# Patient Record
Sex: Female | Born: 1990 | Race: White | Hispanic: No | Marital: Married | Smoking: Former smoker
Health system: Southern US, Community
[De-identification: ages and names within clinical notes are randomized; demographics above are authoritative.]

## PROBLEM LIST (undated history)

## (undated) ENCOUNTER — Inpatient Hospital Stay (HOSPITAL_COMMUNITY): Payer: Self-pay

## (undated) DIAGNOSIS — F32A Depression, unspecified: Secondary | ICD-10-CM

## (undated) DIAGNOSIS — Z789 Other specified health status: Secondary | ICD-10-CM

## (undated) DIAGNOSIS — D649 Anemia, unspecified: Secondary | ICD-10-CM

## (undated) DIAGNOSIS — F329 Major depressive disorder, single episode, unspecified: Secondary | ICD-10-CM

## (undated) DIAGNOSIS — F419 Anxiety disorder, unspecified: Secondary | ICD-10-CM

## (undated) HISTORY — PX: NO PAST SURGERIES: SHX2092

## (undated) HISTORY — PX: WISDOM TOOTH EXTRACTION: SHX21

---
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
## 2010-04-20 LAB — URINALYSIS, ROUTINE W REFLEX MICROSCOPIC
Bilirubin Urine: NEGATIVE
Glucose, UA: NEGATIVE mg/dL
Hgb urine dipstick: NEGATIVE
Ketones, ur: NEGATIVE mg/dL
Nitrite: NEGATIVE
Protein, ur: NEGATIVE mg/dL
Specific Gravity, Urine: 1.01 (ref 1.005–1.030)
Urobilinogen, UA: 0.2 mg/dL (ref 0.0–1.0)
pH: 7 (ref 5.0–8.0)

## 2010-04-20 LAB — POCT PREGNANCY, URINE: Preg Test, Ur: NEGATIVE

## 2010-10-28 LAB — WET PREP, GENITAL
Clue Cells Wet Prep HPF POC: NONE SEEN
Trich, Wet Prep: NONE SEEN
Yeast Wet Prep HPF POC: NONE SEEN

## 2010-10-28 LAB — DIFFERENTIAL
Basophils Relative: 0
Eosinophils Absolute: 0
Eosinophils Relative: 1
Lymphs Abs: 2.7
Monocytes Relative: 6

## 2010-10-28 LAB — URINALYSIS, ROUTINE W REFLEX MICROSCOPIC
Bilirubin Urine: NEGATIVE
Glucose, UA: NEGATIVE
Hgb urine dipstick: NEGATIVE
Nitrite: NEGATIVE
Protein, ur: NEGATIVE
Specific Gravity, Urine: 1.034 — ABNORMAL HIGH
Urobilinogen, UA: 0.2
pH: 6

## 2010-10-28 LAB — CBC
HCT: 34.3 — ABNORMAL LOW
MCHC: 34.3
MCV: 79.9
Platelets: 228
WBC: 7.2

## 2010-10-28 LAB — BASIC METABOLIC PANEL
BUN: 9
CO2: 23
Chloride: 104
Creatinine, Ser: 0.75
Potassium: 3.5

## 2010-10-28 LAB — POCT PREGNANCY, URINE
Operator id: 22937
Preg Test, Ur: NEGATIVE

## 2010-10-28 LAB — URINE CULTURE
Colony Count: NO GROWTH
Culture: NO GROWTH

## 2010-10-28 LAB — GC/CHLAMYDIA PROBE AMP, GENITAL: Chlamydia, DNA Probe: NEGATIVE

## 2010-12-16 ENCOUNTER — Encounter: Payer: Self-pay | Admitting: Emergency Medicine

## 2010-12-16 ENCOUNTER — Emergency Department (HOSPITAL_COMMUNITY)
Admission: EM | Admit: 2010-12-16 | Discharge: 2010-12-16 | Disposition: A | Discharge status: Home / Self Care | Payer: Medicaid Other | Attending: Emergency Medicine | Admitting: Emergency Medicine

## 2010-12-16 DIAGNOSIS — R300 Dysuria: Secondary | ICD-10-CM

## 2010-12-16 DIAGNOSIS — R10819 Abdominal tenderness, unspecified site: Secondary | ICD-10-CM | POA: Insufficient documentation

## 2010-12-16 DIAGNOSIS — N898 Other specified noninflammatory disorders of vagina: Secondary | ICD-10-CM | POA: Insufficient documentation

## 2010-12-16 LAB — URINALYSIS, ROUTINE W REFLEX MICROSCOPIC
Glucose, UA: NEGATIVE mg/dL
Hgb urine dipstick: NEGATIVE
Ketones, ur: NEGATIVE mg/dL
Protein, ur: NEGATIVE mg/dL
Urobilinogen, UA: 0.2 mg/dL (ref 0.0–1.0)

## 2010-12-16 LAB — WET PREP, GENITAL: Clue Cells Wet Prep HPF POC: NONE SEEN

## 2010-12-16 MED ORDER — SULFAMETHOXAZOLE-TRIMETHOPRIM 800-160 MG PO TABS: 1.0000 | ORAL_TABLET | Freq: Two times a day (BID) | ORAL | Status: AC

## 2010-12-16 NOTE — ED Notes (Signed)
Introduced self to patient. Asked her to change into a gown.

## 2010-12-16 NOTE — ED Provider Notes (Signed)
History     CSN: [id number] Arrival date & time: 12/16/2010  6:18 AM   First MD Initiated Contact with Patient 12/16/10 0701      Chief Complaint  Patient presents with  . Urinary Tract Infection    pt  c/o constant pressure and urge to urinate    (Consider location/radiation/quality/duration/timing/severity/associated sxs/prior treatment) Patient is a 20 y.o. female presenting with urinary tract infection. The history is provided by the patient.  Urinary Tract Infection   Patient here with complaints of urinary urgency and frequency x24 hours. Denies fever or back pain or green-yellow vaginal discharge. History of UTI before before in the past and this feels like that. No vomiting or diarrhea noted symptoms worse with urinating pain is characterized as sharp History reviewed. No pertinent past medical history.  History reviewed. No pertinent past surgical history.  Family History  Problem Relation Age of Onset  . Diabetes Other     History  Substance Use Topics  . Smoking status: Never Smoker   . Smokeless tobacco: Not on file  . Alcohol Use: Yes     socialy    OB History    Grav Para Term Preterm Abortions TAB SAB Ect Mult Living                  Review of Systems  All other systems reviewed and are negative.    Allergies  Penicillins  Home Medications  No current outpatient prescriptions on file.  BP 109/62  Pulse 60  Temp(Src) 98.3 F (36.8 C) (Oral)  Resp 16  Ht 5\' 7"  (1.702 m)  Wt 160 lb (72.576 kg)  BMI 25.06 kg/m2  SpO2 100%  LMP 11/26/2010  Physical Exam  Nursing note and vitals reviewed. Constitutional: She is oriented to person, place, and time. Vital signs are normal. She appears well-developed and well-nourished.  Non-toxic appearance. No distress.  HENT:  Head: Normocephalic and atraumatic.  Eyes: Conjunctivae and EOM are normal. Pupils are equal, round, and reactive to light.  Neck: Normal range of motion. Neck supple. No tracheal  deviation present.  Cardiovascular: Normal rate, regular rhythm and normal heart sounds.  Exam reveals no gallop.   No murmur heard. Pulmonary/Chest: Effort normal and breath sounds normal. No stridor. No respiratory distress. She has no wheezes.  Abdominal: Soft. Normal appearance and bowel sounds are normal. She exhibits no distension. There is tenderness in the suprapubic area. There is no rigidity, no rebound and no guarding.  Genitourinary: No bleeding around the vagina. No foreign body around the vagina. Vaginal discharge found.  Musculoskeletal: Normal range of motion. She exhibits no edema and no tenderness.  Neurological: She is alert and oriented to person, place, and time. She has normal strength. No cranial nerve deficit or sensory deficit. GCS eye subscore is 4. GCS verbal subscore is 5. GCS motor subscore is 6.  Skin: Skin is warm and dry.  Psychiatric: She has a normal mood and affect. Her speech is normal and behavior is normal.    ED Course  Procedures (including critical care time)   Labs Reviewed  URINALYSIS, ROUTINE W REFLEX MICROSCOPIC  POCT PREGNANCY, URINE   No results found.   No diagnosis found.    MDM  Patient to be given prescription for Bactrim in case her symptoms persist. wet prep results noted        Toy Baker, MD 12/16/10 [phone]

## 2010-12-16 NOTE — ED Notes (Addendum)
Pt presented to the ER with c/o possible urinary infection, pt states has Hx of UTI, pt also reports tick,white vaginal d/c. Pt further states that s/s started about month ago, while at work, pt states she is holding to urinate, secondary to the nature of her  Job. Pt states that she noted vaginal d/c as well, whit and thick discharge

## 2010-12-16 NOTE — ED Notes (Signed)
Pt states that symptoms have been present for a month.  She did not have current medicaid so she did not go to her PCP for symptoms. She took AZO for the symptoms, but "it doesn't work well."

## 2010-12-20 LAB — GC/CHLAMYDIA PROBE AMP, GENITAL
Chlamydia, DNA Probe: POSITIVE — AB
GC Probe Amp, Genital: NEGATIVE

## 2010-12-24 NOTE — ED Notes (Signed)
+   chlamydia, patient notified of positive result by phone after ID verified x two. RX Azithromycin one gram PO called to [facility] on [address] per pt request.

## 2011-11-07 ENCOUNTER — Emergency Department (HOSPITAL_COMMUNITY)
Admission: EM | Admit: 2011-11-07 | Discharge: 2011-11-07 | Disposition: A | Discharge status: Home / Self Care | Payer: Medicaid Other | Attending: Emergency Medicine | Admitting: Emergency Medicine

## 2011-11-07 ENCOUNTER — Encounter (HOSPITAL_COMMUNITY): Payer: Self-pay

## 2011-11-07 DIAGNOSIS — F17200 Nicotine dependence, unspecified, uncomplicated: Secondary | ICD-10-CM | POA: Insufficient documentation

## 2011-11-07 DIAGNOSIS — Z880 Allergy status to penicillin: Secondary | ICD-10-CM | POA: Insufficient documentation

## 2011-11-07 DIAGNOSIS — Z3201 Encounter for pregnancy test, result positive: Secondary | ICD-10-CM | POA: Insufficient documentation

## 2011-11-07 DIAGNOSIS — Z3490 Encounter for supervision of normal pregnancy, unspecified, unspecified trimester: Secondary | ICD-10-CM

## 2011-11-07 LAB — POCT PREGNANCY, URINE: Preg Test, Ur: POSITIVE — AB

## 2011-11-07 MED ORDER — PRENATAL COMPLETE 14-0.4 MG PO TABS: 1.0000 | ORAL_TABLET | Freq: Every day | ORAL | Status: DC

## 2011-11-07 NOTE — ED Notes (Signed)
Discharged by Sherlynn Stalls RN- Social services in to speak with this pt before discharge

## 2011-11-07 NOTE — ED Notes (Signed)
Pt presents with no acute distress.  Pt took home pregnancy POSITIVE- Denies abdominal pain, vaginal discharge N/V/D and fever.  Here only for verification of pregnancy for validation for medicaid

## 2011-11-07 NOTE — ED Provider Notes (Signed)
Medical screening examination/treatment/procedure(s) were performed by non-physician practitioner and as supervising physician I was immediately available for consultation/collaboration.  Cheri Guppy, MD 11/07/11 1630

## 2011-11-07 NOTE — ED Provider Notes (Signed)
History     CSN: [id number]  Arrival date & time 11/07/11  1001   First MD Initiated Contact with Patient 11/07/11 1029      Chief Complaint  Patient presents with  . requesting pregnancy test     (Consider location/radiation/quality/duration/timing/severity/associated sxs/prior treatment) HPI Comments: G0P0 presents to ED requesting confirmation of a positive pregnancy test.  States LMP was August 5, states she spotted August 30-September 2 but never had her period.  Has had intermittent mild abdominal cramping and nausea, also constipation.  Abdominal cramping was resolved after large bowel movement this morning.  States she took pregnancy tests all last week that were negative, then 4 days ago took one that was positive.  Is here for confirmation so she can be approved for medicaid.  Denies fevers, vomiting, any recent vaginal bleeding or abnormal discharge. Denies urinary symptoms.  Denies current abdominal pain or cramping.   The history is provided by the patient.    History reviewed. No pertinent past medical history.  History reviewed. No pertinent past surgical history.  Family History  Problem Relation Age of Onset  . Diabetes Other     History  Substance Use Topics  . Smoking status: Current Every Day Smoker  . Smokeless tobacco: Not on file  . Alcohol Use: Yes     socialy    OB History    Grav Para Term Preterm Abortions TAB SAB Ect Mult Living                  Review of Systems  Constitutional: Negative for fever.  Gastrointestinal: Positive for nausea. Negative for vomiting and abdominal pain.  Genitourinary: Positive for menstrual problem. Negative for dysuria, urgency, frequency, vaginal bleeding and vaginal discharge.    Allergies  Penicillins  Home Medications   Current Outpatient Rx  Name Route Sig Dispense Refill  . ACETAMINOPHEN 500 MG PO TABS Oral Take 1,000 mg by mouth every 6 (six) hours as needed. For pain.    Marland Kitchen PRENATAL MULTIVITAMIN  CH Oral Take 2 tablets by mouth daily.      BP 129/78  Pulse 81  Temp 98.6 F (37 C) (Oral)  Resp 18  Ht 5\' 7"  (1.702 m)  Wt 138 lb 6.4 oz (62.778 kg)  BMI 21.68 kg/m2  SpO2 100%  LMP 10/12/2011  Physical Exam  Nursing note and vitals reviewed. Constitutional: She appears well-developed and well-nourished. No distress.  HENT:  Head: Normocephalic and atraumatic.  Neck: Neck supple.  Cardiovascular: Normal rate and regular rhythm.   Pulmonary/Chest: Effort normal and breath sounds normal. No respiratory distress. She has no wheezes. She has no rales.  Abdominal: Soft. She exhibits no distension and no mass. There is no tenderness. There is no rebound and no guarding.  Neurological: She is alert.  Skin: She is not diaphoretic.    ED Course  Procedures (including critical care time)  Labs Reviewed  POCT PREGNANCY, URINE - Abnormal; Notable for the following:    Preg Test, Ur POSITIVE (*)     All other components within normal limits   No results found.   1. Pregnancy     MDM  Pt presenting to ED for confirmation of positive pregnancy test.  Pregnancy test is positive.  Pt denies abdominal pain, vaginal discharge or bleeding.  Pt advised that she needs to take daily prenatal vitamin and follow up with obgyn.  Resources given.  Pt also advised that she should go to [facility] with any abdominal  pain, vaginal discharge or bleeding. Discussed ectopic pregnancy with patient's significant other and importance of going to [facility] given previously mentioned symptoms.  Discussed all results with patient.  Pt given return precautions.  Pt verbalizes understanding and agrees with plan.           [place], [place] 11/07/11 1127

## 2011-11-08 ENCOUNTER — Encounter (HOSPITAL_COMMUNITY): Payer: Self-pay

## 2011-12-11 ENCOUNTER — Other Ambulatory Visit: Payer: Self-pay

## 2011-12-11 ENCOUNTER — Other Ambulatory Visit (HOSPITAL_COMMUNITY): Payer: Self-pay | Admitting: Obstetrics and Gynecology

## 2011-12-11 ENCOUNTER — Other Ambulatory Visit: Payer: Self-pay | Admitting: Family

## 2011-12-11 DIAGNOSIS — Z3682 Encounter for antenatal screening for nuchal translucency: Secondary | ICD-10-CM

## 2011-12-18 ENCOUNTER — Encounter (HOSPITAL_COMMUNITY): Payer: Self-pay | Admitting: Obstetrics and Gynecology

## 2011-12-19 ENCOUNTER — Encounter (HOSPITAL_COMMUNITY): Payer: Self-pay | Admitting: Obstetrics and Gynecology

## 2012-01-01 ENCOUNTER — Other Ambulatory Visit: Payer: Self-pay

## 2012-01-01 ENCOUNTER — Encounter (HOSPITAL_COMMUNITY): Payer: Self-pay

## 2012-01-01 ENCOUNTER — Ambulatory Visit (HOSPITAL_COMMUNITY)
Admission: RE | Admit: 2012-01-01 | Discharge: 2012-01-01 | Disposition: A | Discharge status: Home / Self Care | Payer: Medicaid Other | Source: Ambulatory Visit | Attending: Obstetrics and Gynecology | Admitting: Obstetrics and Gynecology

## 2012-01-01 DIAGNOSIS — Z3689 Encounter for other specified antenatal screening: Secondary | ICD-10-CM | POA: Insufficient documentation

## 2012-01-01 DIAGNOSIS — O351XX0 Maternal care for (suspected) chromosomal abnormality in fetus, not applicable or unspecified: Secondary | ICD-10-CM | POA: Insufficient documentation

## 2012-01-01 DIAGNOSIS — O3510X0 Maternal care for (suspected) chromosomal abnormality in fetus, unspecified, not applicable or unspecified: Secondary | ICD-10-CM | POA: Insufficient documentation

## 2012-01-01 DIAGNOSIS — Z3682 Encounter for antenatal screening for nuchal translucency: Secondary | ICD-10-CM

## 2012-01-01 NOTE — Progress Notes (Signed)
Ms. Erica Webb had an ultrasound appointment today.  Please see [facility] report for details.  Impression Mason Jim, intrauterine pregnancy at 12w 2d on first trimester evaluation with limited morphologic survey that is appropriate for the assigned gestational age. The nuchal translucency measures 1.1 mm, which is normal for the gestational age. The nasal bone is seen on today's evaluation and recorded as present on the risk assessment.   Recommendations Regardless of the results of today's evaluation, completion of the first trimester screen analytes is both recommended and arranged for your patient. I also recommend a second trimester AFP to occur around 15-[redacted] weeks gestational age. Anatomy scan has been previously scheduled to occur at around [redacted] weeks gestation.  Rogelia Boga, MD

## 2012-01-03 LAB — OB RESULTS CONSOLE GC/CHLAMYDIA: Gonorrhea: NEGATIVE

## 2012-01-03 LAB — OB RESULTS CONSOLE ANTIBODY SCREEN: Antibody Screen: NEGATIVE

## 2012-01-17 ENCOUNTER — Emergency Department (HOSPITAL_COMMUNITY): Payer: Medicaid Other

## 2012-01-17 ENCOUNTER — Encounter (HOSPITAL_COMMUNITY): Payer: Self-pay

## 2012-01-17 ENCOUNTER — Emergency Department (HOSPITAL_COMMUNITY)
Admission: EM | Admit: 2012-01-17 | Discharge: 2012-01-17 | Disposition: A | Discharge status: Home / Self Care | Payer: Medicaid Other | Attending: Emergency Medicine | Admitting: Emergency Medicine

## 2012-01-17 DIAGNOSIS — O2340 Unspecified infection of urinary tract in pregnancy, unspecified trimester: Secondary | ICD-10-CM

## 2012-01-17 DIAGNOSIS — O210 Mild hyperemesis gravidarum: Secondary | ICD-10-CM | POA: Insufficient documentation

## 2012-01-17 DIAGNOSIS — J069 Acute upper respiratory infection, unspecified: Secondary | ICD-10-CM | POA: Insufficient documentation

## 2012-01-17 DIAGNOSIS — N390 Urinary tract infection, site not specified: Secondary | ICD-10-CM | POA: Insufficient documentation

## 2012-01-17 DIAGNOSIS — R109 Unspecified abdominal pain: Secondary | ICD-10-CM | POA: Insufficient documentation

## 2012-01-17 DIAGNOSIS — R197 Diarrhea, unspecified: Secondary | ICD-10-CM | POA: Insufficient documentation

## 2012-01-17 DIAGNOSIS — O9989 Other specified diseases and conditions complicating pregnancy, childbirth and the puerperium: Secondary | ICD-10-CM | POA: Insufficient documentation

## 2012-01-17 LAB — INFLUENZA PANEL BY PCR (TYPE A & B): H1N1 flu by pcr: NOT DETECTED

## 2012-01-17 LAB — URINE MICROSCOPIC-ADD ON

## 2012-01-17 LAB — URINALYSIS, ROUTINE W REFLEX MICROSCOPIC
Bilirubin Urine: NEGATIVE
Ketones, ur: NEGATIVE mg/dL
Nitrite: NEGATIVE
Protein, ur: NEGATIVE mg/dL
Urobilinogen, UA: 0.2 mg/dL (ref 0.0–1.0)

## 2012-01-17 MED ORDER — PROMETHAZINE HCL 25 MG PO TABS: 25.0000 mg | ORAL_TABLET | Freq: Four times a day (QID) | ORAL | Status: DC | PRN

## 2012-01-17 MED ORDER — NITROFURANTOIN MONOHYD MACRO 100 MG PO CAPS: 100.0000 mg | ORAL_CAPSULE | Freq: Two times a day (BID) | ORAL | Status: DC

## 2012-01-17 MED ORDER — OSELTAMIVIR PHOSPHATE 75 MG PO CAPS: 75.0000 mg | ORAL_CAPSULE | Freq: Two times a day (BID) | ORAL | Status: DC

## 2012-01-17 MED ORDER — INFLUENZA VIRUS VACC SPLIT PF IM SUSP
0.5000 mL | INTRAMUSCULAR | Status: AC | PRN
  Administered 2012-01-17: 0.5 mL via INTRAMUSCULAR
  Filled 2012-01-17: qty 0.5

## 2012-01-17 MED ORDER — NITROFURANTOIN MONOHYD MACRO 100 MG PO CAPS
100.0000 mg | ORAL_CAPSULE | Freq: Once | ORAL | Status: AC
  Administered 2012-01-17: 100 mg via ORAL
  Filled 2012-01-17: qty 1

## 2012-01-17 MED ORDER — ONDANSETRON HCL 4 MG PO TABS
4.0000 mg | ORAL_TABLET | Freq: Once | ORAL | Status: AC
  Administered 2012-01-17: 4 mg via ORAL
  Filled 2012-01-17: qty 1

## 2012-01-17 MED ORDER — OSELTAMIVIR PHOSPHATE 75 MG PO CAPS
75.0000 mg | ORAL_CAPSULE | Freq: Once | ORAL | Status: AC
  Administered 2012-01-17: 75 mg via ORAL
  Filled 2012-01-17: qty 1

## 2012-01-17 NOTE — ED Notes (Signed)
Pt c/o cough/fever/ bilateral ear pain/diarrhea/vomiting x3 days, pt state is [redacted]wk pregnant.

## 2012-01-17 NOTE — ED Provider Notes (Signed)
History     CSN: [id number]  Arrival date & time 01/17/12  0711   First MD Initiated Contact with Patient 01/17/12 0756      Chief Complaint  Patient presents with  . URI    14preg     HPI  The patient presents with multiple complaints.  Symptoms began approximately 3 days ago.  Since onset symptoms have been progressive.  She complains of cough, generalized discomfort, fever, diarrhea, vomiting, bilateral ear discomfort.  Since the onset of symptoms she has been taking azithromycin, with no appreciable change in her condition.  She notes that her fever improved with Tylenol, but recurs.  She denies confusion or disorientation.  She has spoken with her obstetrician during this illness, because she is [redacted] weeks pregnant.  This pregnancy has been complicated, with one prior ultrasound that was normal according to the patient.  No influenza vaccination.  History reviewed. No pertinent past medical history.  History reviewed. No pertinent past surgical history.  Family History  Problem Relation Age of Onset  . Diabetes Other     History  Substance Use Topics  . Smoking status: Never Smoker   . Smokeless tobacco: Not on file  . Alcohol Use: No     Comment: socialy    OB History    Grav Para Term Preterm Abortions TAB SAB Ect Mult Living   1               Review of Systems  Constitutional:       Per HPI, otherwise negative  HENT:       Per HPI, otherwise negative  Eyes: Negative.   Respiratory:       Per HPI, otherwise negative  Cardiovascular:       Per HPI, otherwise negative  Gastrointestinal: Positive for nausea, vomiting, abdominal pain and diarrhea.  Genitourinary: Negative.   Musculoskeletal:       Per HPI, otherwise negative  Skin: Negative.   Neurological: Negative for syncope.    Allergies  Penicillins  Home Medications   Current Outpatient Rx  Name  Route  Sig  Dispense  Refill  . ACETAMINOPHEN 500 MG PO TABS   Oral   Take 1,000 mg by mouth  every 6 (six) hours as needed. For pain.         Marland Kitchen PRENATAL COMPLETE 14-0.4 MG PO TABS   Oral   Take 1 tablet by mouth daily.   60 each   5   . PROMETHAZINE HCL 25 MG PO TABS   Oral   Take 25 mg by mouth every 6 (six) hours as needed. nausea           BP 127/66  Pulse 110  Temp 99.1 F (37.3 C) (Oral)  Resp 16  SpO2 99%  LMP 09/11/2011  Physical Exam  Nursing note and vitals reviewed. Constitutional: She is oriented to person, place, and time. She appears well-developed and well-nourished. No distress.  HENT:  Head: Normocephalic and atraumatic.  Eyes: Conjunctivae normal and EOM are normal.  Cardiovascular: Regular rhythm.  Tachycardia present.   Pulmonary/Chest: Effort normal and breath sounds normal. No stridor. No respiratory distress.  Abdominal: Normal appearance. She exhibits no distension. There is no tenderness.    Musculoskeletal: She exhibits no edema.  Neurological: She is alert and oriented to person, place, and time. No cranial nerve deficit.  Skin: Skin is warm and dry.  Psychiatric: She has a normal mood and affect.    ED Course  [place] bedside Date/Time: 01/17/2012 8:15 AM Performed by: Gerhard Munch Authorized by: Gerhard Munch Consent: Verbal consent obtained. The procedure was performed in an emergent situation. Risks and benefits: risks, benefits and alternatives were discussed Consent given by: patient Patient identity confirmed: verbally with patient Time out: Immediately prior to procedure a "time out" was called to verify the correct patient, procedure, equipment, support staff and site/side marked as required. Preparation: Patient was prepped and draped in the usual sterile fashion. Local anesthesia used: no Patient sedated: no Patient tolerance: Patient tolerated the procedure well with no immediate complications. Comments: Indication: abd pain / fever/ n/v/d in pregnanacy  FHT ~170 w good motion. IUP visualized. Procedure well  tolerated.    (including critical care time)  Labs Reviewed  POCT PREGNANCY, URINE - Abnormal; Notable for the following:    Preg Test, Ur POSITIVE (*)     All other components within normal limits  URINALYSIS, ROUTINE W REFLEX MICROSCOPIC  INFLUENZA PANEL BY PCR   No results found.   No diagnosis found.  O2- 99%ra, normal  10:04 AM I informed the patient of all results thus far.  We discussed the need for prompt F/U.    MDM  This generally well female, [redacted] weeks pregnant, presents with multiple complaints, notably cough, generalized discomfort, fever, nausea, vomiting.  Given her description of symptoms, the consideration of influenza.  Given the increased M/M of pregnant females with influenza, and the current ongoing environment with demonstrated cases locally, the patient received vaccine, initial antivirals, and labs were sent.  The patient's evaluation is notable for demonstration of urinary tract infection as well.  She received antibiotics for this.  She was discharged in stable condition, though with the need for close follow-up, including the need to receive confirmation of influenza.    Gerhard Munch, MD 01/17/12 1007

## 2012-01-18 LAB — URINE CULTURE

## 2012-02-01 ENCOUNTER — Other Ambulatory Visit (HOSPITAL_COMMUNITY): Payer: Self-pay | Admitting: Obstetrics and Gynecology

## 2012-02-01 DIAGNOSIS — Z0489 Encounter for examination and observation for other specified reasons: Secondary | ICD-10-CM

## 2012-02-07 NOTE — L&D Delivery Note (Signed)
Delivery Note I was called to attend this delivery due to precipitous pushing. At 1:27 AM a viable female was delivered via Vaginal, Spontaneous Delivery (Presentation: Right Occiput Anterior). A loose nuchal cord was  Reduced.  The shoulders were tight, so the posterior (right) axilla was grasped and the baby rotated clockwise into an oblique position.  The (now) anterior shoulder was released, and the body delivered slowly after that.  At no time was any traction placed on the fetal head. APGAR: , ; weight 7lbs 11 oz .  At this point, Dr. Henderson Cloud came in and took over care. Placenta status: , .  Cord: 3 vessels with the following complications: mild shoulder dystocia  Erica Webb,Erica Webb 07/12/2012, 1:37 AM    Addendum  Although I was on L&D, I was not called for delivery.  After the delivery by midwife above, I noted a 1st degree subclitoral tear that I repaired with 3-0 vicryl R with a catheter in place.  Placenta was intact and the blood loss was expected.  Baby was vigorous to bedside.  Apgars were 7,9.  [name] A

## 2012-02-12 ENCOUNTER — Ambulatory Visit (HOSPITAL_COMMUNITY)
Admission: RE | Admit: 2012-02-12 | Discharge: 2012-02-12 | Disposition: A | Discharge status: Home / Self Care | Payer: Medicaid Other | Source: Ambulatory Visit | Attending: Obstetrics and Gynecology | Admitting: Obstetrics and Gynecology

## 2012-02-12 ENCOUNTER — Other Ambulatory Visit (HOSPITAL_COMMUNITY): Payer: Self-pay | Admitting: Obstetrics and Gynecology

## 2012-02-12 VITALS — BP 111/63 | HR 93 | Wt 139.5 lb

## 2012-02-12 DIAGNOSIS — Z1389 Encounter for screening for other disorder: Secondary | ICD-10-CM | POA: Insufficient documentation

## 2012-02-12 DIAGNOSIS — O4440 Low lying placenta NOS or without hemorrhage, unspecified trimester: Secondary | ICD-10-CM

## 2012-02-12 DIAGNOSIS — Z0489 Encounter for examination and observation for other specified reasons: Secondary | ICD-10-CM

## 2012-02-12 DIAGNOSIS — Z363 Encounter for antenatal screening for malformations: Secondary | ICD-10-CM | POA: Insufficient documentation

## 2012-02-12 DIAGNOSIS — O358XX0 Maternal care for other (suspected) fetal abnormality and damage, not applicable or unspecified: Secondary | ICD-10-CM | POA: Insufficient documentation

## 2012-02-12 NOTE — Progress Notes (Signed)
[facility] ultrasound  Indication: 22 yr old G1P0 at [redacted]w[redacted]d for fetal anatomic survey.  Findings: 1. Single intrauterine pregnancy. 2. Fetal biometry is consistent with dating. 3. Posterior placenta that is low lying; the placental edge is 2.2cm from the internal cervical os. 4. Normal transvaginal cervical length. 5. Normal amniotic fluid volume. 6. Normal fetal anatomic survey.  Recommendations: 1. Appropriate fetal growth. 2. Normal fetal anatomic survey. 3. Had normal first trimester screen. 4. Low lying placenta: - Discussed slight increased risk of bleeding - do not recommend activity restrictions unless bleeding develops - Discussed bleeding precautions- patient is to call her primary Ob with any vaginal bleeding  - discussed that given placenta is 2.2cm from the os; even if there is no change is likely okay for vaginal delivery ; however recommend follow up ultrasound at 28 weeks to reevaluate placental location  Eulis Foster, MD

## 2012-03-30 ENCOUNTER — Encounter (HOSPITAL_COMMUNITY): Payer: Self-pay | Admitting: Emergency Medicine

## 2012-03-30 ENCOUNTER — Emergency Department (HOSPITAL_COMMUNITY)
Admission: EM | Admit: 2012-03-30 | Discharge: 2012-03-30 | Disposition: A | Discharge status: Home / Self Care | Payer: Medicaid Other | Attending: Emergency Medicine | Admitting: Emergency Medicine

## 2012-03-30 DIAGNOSIS — N390 Urinary tract infection, site not specified: Secondary | ICD-10-CM

## 2012-03-30 DIAGNOSIS — J3489 Other specified disorders of nose and nasal sinuses: Secondary | ICD-10-CM | POA: Insufficient documentation

## 2012-03-30 DIAGNOSIS — J069 Acute upper respiratory infection, unspecified: Secondary | ICD-10-CM

## 2012-03-30 DIAGNOSIS — J029 Acute pharyngitis, unspecified: Secondary | ICD-10-CM | POA: Insufficient documentation

## 2012-03-30 DIAGNOSIS — Z3490 Encounter for supervision of normal pregnancy, unspecified, unspecified trimester: Secondary | ICD-10-CM

## 2012-03-30 DIAGNOSIS — R350 Frequency of micturition: Secondary | ICD-10-CM | POA: Insufficient documentation

## 2012-03-30 DIAGNOSIS — L299 Pruritus, unspecified: Secondary | ICD-10-CM | POA: Insufficient documentation

## 2012-03-30 DIAGNOSIS — R059 Cough, unspecified: Secondary | ICD-10-CM | POA: Insufficient documentation

## 2012-03-30 DIAGNOSIS — H9209 Otalgia, unspecified ear: Secondary | ICD-10-CM | POA: Insufficient documentation

## 2012-03-30 DIAGNOSIS — R0982 Postnasal drip: Secondary | ICD-10-CM | POA: Insufficient documentation

## 2012-03-30 DIAGNOSIS — R05 Cough: Secondary | ICD-10-CM | POA: Insufficient documentation

## 2012-03-30 DIAGNOSIS — O9989 Other specified diseases and conditions complicating pregnancy, childbirth and the puerperium: Secondary | ICD-10-CM | POA: Insufficient documentation

## 2012-03-30 DIAGNOSIS — O2390 Unspecified genitourinary tract infection in pregnancy, unspecified trimester: Secondary | ICD-10-CM | POA: Insufficient documentation

## 2012-03-30 DIAGNOSIS — R300 Dysuria: Secondary | ICD-10-CM | POA: Insufficient documentation

## 2012-03-30 LAB — URINALYSIS, ROUTINE W REFLEX MICROSCOPIC
Glucose, UA: NEGATIVE mg/dL
Hgb urine dipstick: NEGATIVE
Ketones, ur: NEGATIVE mg/dL
Protein, ur: NEGATIVE mg/dL
Urobilinogen, UA: 0.2 mg/dL (ref 0.0–1.0)

## 2012-03-30 LAB — URINE MICROSCOPIC-ADD ON

## 2012-03-30 MED ORDER — NITROFURANTOIN MONOHYD MACRO 100 MG PO CAPS: 100.0000 mg | ORAL_CAPSULE | Freq: Two times a day (BID) | ORAL | Status: DC

## 2012-03-30 NOTE — ED Provider Notes (Signed)
History     CSN: [id number]  Arrival date & time 03/30/12  1143   First MD Initiated Contact with Patient 03/30/12 1213      Chief Complaint  Patient presents with  . Otalgia  . Sore Throat    (Consider location/radiation/quality/duration/timing/severity/associated sxs/prior treatment) HPI Comments: Patient presents today with sore throat, ear pain, productive cough, sinus pressure, rhinorrhea, and nasal congestion.  She reports that her symptoms began three days ago and have been progressively worsening.  She has not taken anything for her symptoms prior to arrival in the ED.  She reports that her husband and coworker have similar symptoms.  Daughter was diagnosed with Strep throat yesterday.  She denies fever, chest pain, or SOB.  Denies nausea, vomiting, or diarrhea.  She is currently pregnant.  Denies abdominal pain, pelvic pain, vaginal discharge or vaginal bleeding.  She is also complaining of dysuria and increased frequency over the past several days.  She has a history of Urinary Tract Infections.    The history is provided by the patient.    History reviewed. No pertinent past medical history.  History reviewed. No pertinent past surgical history.  Family History  Problem Relation Age of Onset  . Diabetes Other     History  Substance Use Topics  . Smoking status: Never Smoker   . Smokeless tobacco: Not on file  . Alcohol Use: No     Comment: socialy    OB History   Grav Para Term Preterm Abortions TAB SAB Ect Mult Living   1               Review of Systems  Constitutional: Negative for fever and chills.  HENT: Positive for ear pain, congestion, sore throat, rhinorrhea, postnasal drip and sinus pressure. Negative for drooling, trouble swallowing, neck pain, neck stiffness and voice change.   Respiratory: Positive for cough. Negative for shortness of breath and wheezing.   Gastrointestinal: Negative for nausea, vomiting and abdominal pain.  Genitourinary:  Positive for dysuria and frequency. Negative for urgency, vaginal bleeding and pelvic pain.  Neurological: Negative for headaches.  All other systems reviewed and are negative.    Allergies  Penicillins  Home Medications   Current Outpatient Rx  Name  Route  Sig  Dispense  Refill  . Prenatal Vit-Fe Fumarate-FA (PRENATAL COMPLETE) 14-0.4 MG TABS   Oral   Take 1 tablet by mouth daily.   60 each   5     BP 112/66  Pulse 93  Temp(Src) 98.7 F (37.1 C) (Oral)  Resp 17  SpO2 100%  LMP 09/11/2011  Physical Exam  Nursing note and vitals reviewed. Constitutional: She appears well-developed and well-nourished. No distress.  HENT:  Head: Normocephalic and atraumatic. No trismus in the jaw.  Right Ear: Tympanic membrane, external ear and ear canal normal.  Left Ear: Tympanic membrane, external ear and ear canal normal.  Nose: Rhinorrhea present. Right sinus exhibits no maxillary sinus tenderness and no frontal sinus tenderness. Left sinus exhibits no maxillary sinus tenderness and no frontal sinus tenderness.  Mouth/Throat: Uvula is midline, oropharynx is clear and moist and mucous membranes are normal. No edematous. No oropharyngeal exudate, posterior oropharyngeal edema or tonsillar abscesses.  Neck: Normal range of motion. Neck supple.  Cardiovascular: Normal rate, regular rhythm and normal heart sounds.   Pulmonary/Chest: Effort normal and breath sounds normal.  Neurological: She is alert.  Skin: Skin is warm and dry. No rash noted. She is not diaphoretic.  ED Course  Procedures (including critical care time)  Labs Reviewed  RAPID STREP SCREEN  URINALYSIS, ROUTINE W REFLEX MICROSCOPIC   No results found.   No diagnosis found.    MDM  Symptoms consistent with Viral URI.  Patient afebrile with pulse ox 100 on RA.  Therefore, do not feel that CXR is indicated at this time.  Patient is currently pregnant.  She denies any vaginal bleeding, abdominal or pelvic pain.   UA shows small leukocytes and many bacteria.  However, appears to be contaminated sample.  However, due to the fact that the patient is having symptoms will treat.  Feel patient stable for discharge.  Return precautions discussed.        Pascal Lux [place], PA-C 03/30/12 [phone]

## 2012-03-30 NOTE — ED Notes (Signed)
Pt states that she started having itching and "fluid like feeling in her ears" as well as sore and scratchy throat.  Her step daughter was tested positive for strep throat yesterday.  Pt states that she also has been coughing up green colored phlegm for 2 days. Denies n/v, or  Fevers.

## 2012-03-31 LAB — URINE CULTURE
Colony Count: NO GROWTH
Special Requests: NORMAL

## 2012-03-31 NOTE — ED Provider Notes (Signed)
Medical screening examination/treatment/procedure(s) were performed by non-physician practitioner and as supervising physician I was immediately available for consultation/collaboration.  Christopher J. Pollina, MD 03/31/12 0751 

## 2012-04-09 ENCOUNTER — Encounter (HOSPITAL_COMMUNITY): Payer: Self-pay

## 2012-04-09 ENCOUNTER — Ambulatory Visit (HOSPITAL_COMMUNITY): Payer: Medicaid Other

## 2012-04-09 ENCOUNTER — Ambulatory Visit (HOSPITAL_COMMUNITY)
Admission: RE | Admit: 2012-04-09 | Discharge: 2012-04-09 | Disposition: A | Discharge status: Home / Self Care | Payer: Medicaid Other | Source: Ambulatory Visit | Attending: Obstetrics and Gynecology | Admitting: Obstetrics and Gynecology

## 2012-04-09 DIAGNOSIS — Z3689 Encounter for other specified antenatal screening: Secondary | ICD-10-CM | POA: Insufficient documentation

## 2012-04-09 DIAGNOSIS — O4400 Placenta previa specified as without hemorrhage, unspecified trimester: Secondary | ICD-10-CM | POA: Insufficient documentation

## 2012-04-09 DIAGNOSIS — O4440 Low lying placenta NOS or without hemorrhage, unspecified trimester: Secondary | ICD-10-CM

## 2012-05-22 ENCOUNTER — Encounter (HOSPITAL_COMMUNITY): Payer: Self-pay | Admitting: *Deleted

## 2012-05-22 ENCOUNTER — Inpatient Hospital Stay (HOSPITAL_COMMUNITY)
Admission: AD | Admit: 2012-05-22 | Discharge: 2012-05-22 | Disposition: A | Discharge status: Home / Self Care | Payer: Medicaid Other | Source: Ambulatory Visit | Attending: Obstetrics & Gynecology | Admitting: Obstetrics & Gynecology

## 2012-05-22 DIAGNOSIS — O4700 False labor before 37 completed weeks of gestation, unspecified trimester: Secondary | ICD-10-CM | POA: Insufficient documentation

## 2012-05-22 DIAGNOSIS — O479 False labor, unspecified: Secondary | ICD-10-CM

## 2012-05-22 DIAGNOSIS — O4703 False labor before 37 completed weeks of gestation, third trimester: Secondary | ICD-10-CM

## 2012-05-22 DIAGNOSIS — R109 Unspecified abdominal pain: Secondary | ICD-10-CM | POA: Insufficient documentation

## 2012-05-22 LAB — URINALYSIS, ROUTINE W REFLEX MICROSCOPIC
Bilirubin Urine: NEGATIVE
Glucose, UA: NEGATIVE mg/dL
Hgb urine dipstick: NEGATIVE
Ketones, ur: NEGATIVE mg/dL
Protein, ur: NEGATIVE mg/dL
pH: 6 (ref 5.0–8.0)

## 2012-05-22 NOTE — MAU Provider Note (Signed)
Chief Complaint:  Abdominal Pain   First Provider Initiated Contact with Patient 05/22/12 2232      HPI: Erica Webb is a 22 y.o. G1P0 at [redacted]w[redacted]d who presents to maternity admissions reporting  1. Increased pelvic pressure. 2. Intermittent sharp low abd pain and uterine tightening, worse during work yesterday. 3. Mucus discharge x 3 days.   Denies leakage of fluid or vaginal bleeding. Good fetal movement.   Past Medical History: History reviewed. No pertinent past medical history.  Past obstetric history: OB History   Grav Para Term Preterm Abortions TAB SAB Ect Mult Living   1              # Outc Date GA Lbr Len/2nd Wgt Sex Del Anes PTL Lv   1 CUR               Past Surgical History: History reviewed. No pertinent past surgical history.  Family History: Family History  Problem Relation Age of Onset  . Diabetes Other     Social History: History  Substance Use Topics  . Smoking status: Never Smoker   . Smokeless tobacco: Not on file  . Alcohol Use: No     Comment: socialy    Allergies:  Allergies  Allergen Reactions  . Penicillins Anaphylaxis    Pt repots laryngeal edema    Meds:  Prescriptions prior to admission  Medication Sig Dispense Refill  . acetaminophen (TYLENOL) 500 MG tablet Take 1,000 mg by mouth every 6 (six) hours as needed for pain.      . calcium carbonate (TUMS EX) 750 MG chewable tablet Chew 2 tablets by mouth daily as needed for heartburn.      . Prenatal Vit-Fe Fumarate-FA (PRENATAL COMPLETE) 14-0.4 MG TABS Take 1 tablet by mouth daily.  60 each  5    ROS: Pertinent findings in history of present illness.  Physical Exam  Blood pressure 140/77, pulse 95, temperature 98.6 F (37 C), temperature source Oral, resp. rate 18, height 5\' 7"  (1.702 m), weight 73.936 kg (163 lb), last menstrual period 09/11/2011, SpO2 100.00%. GENERAL: Well-developed, well-nourished female in mild distress.  HEENT: normocephalic HEART: normal  rate RESP: normal effort ABDOMEN: Soft, non-tender, gravid appropriate for gestational age EXTREMITIES: Nontender, no edema NEURO: alert and oriented SPECULUM EXAM: NEFG, physiologic discharge, no blood, cervix clean Dilation: 1 Effacement (%): 60 Station: -3 Presentation: Vertex Exam by:: [employer] CNM  FHT:  Baseline 130 , moderate variability, accelerations present, no decelerations Contractions: irreg, mild   Labs: Results for orders placed during the hospital encounter of 05/22/12 (from the past 24 hour(s))  URINALYSIS, ROUTINE W REFLEX MICROSCOPIC     Status: Abnormal   Collection Time    05/22/12  9:35 PM      Result Value Range   Color, Urine YELLOW  YELLOW   APPearance CLEAR  CLEAR   Specific Gravity, Urine >1.030 (*) 1.005 - 1.030   pH 6.0  5.0 - 8.0   Glucose, UA NEGATIVE  NEGATIVE mg/dL   Hgb urine dipstick NEGATIVE  NEGATIVE   Bilirubin Urine NEGATIVE  NEGATIVE   Ketones, ur NEGATIVE  NEGATIVE mg/dL   Protein, ur NEGATIVE  NEGATIVE mg/dL   Urobilinogen, UA 0.2  0.0 - 1.0 mg/dL   Nitrite NEGATIVE  NEGATIVE   Leukocytes, UA NEGATIVE  NEGATIVE  FETAL FIBRONECTIN     Status: None   Collection Time    05/22/12 10:42 PM      Result Value Range  Fetal Fibronectin NEGATIVE  NEGATIVE    Imaging:  No results found.  MAU Course:   Assessment: 1. Preterm uterine contractions, third trimester    Plan: Discharge home per consult w/ Dr. Arlyce Dice. Preterm labor precautions and fetal kick counts. Pelvic rest. Increase rest and fluids.     Follow-up Information   Follow up with Mickel Baas, MD On 05/30/2012.   Contact information:   [address] [place] [address] [phone]       Follow up with [facility]. (As needed if symptoms worsen)    Contact information:   [address] [place] [address] [phone]       Medication List    TAKE these medications        acetaminophen 500 MG tablet  Commonly known as:  TYLENOL  Take 1,000 mg by mouth every 6 (six) hours as needed for pain.     calcium carbonate 750 MG chewable tablet  Commonly known as:  TUMS EX  Chew 2 tablets by mouth daily as needed for heartburn.     Prenatal Complete 14-0.4 MG Tabs  Take 1 tablet by mouth daily.        [place], [place] 05/22/2012 11:23 PM

## 2012-05-22 NOTE — MAU Note (Signed)
Pt presents with complaint for pressure and sharp pains in lower abd. Also reports a mucus discharge x 3 days. States today she has felt lightheaded and her feet are "going numb"

## 2012-06-17 ENCOUNTER — Other Ambulatory Visit: Payer: Self-pay | Admitting: Obstetrics and Gynecology

## 2012-07-11 ENCOUNTER — Inpatient Hospital Stay (HOSPITAL_COMMUNITY)
Admission: AD | Admit: 2012-07-11 | Discharge: 2012-07-13 | DRG: 775 | Disposition: A | Discharge status: Home / Self Care | Payer: Medicaid Other | Source: Ambulatory Visit | Attending: Obstetrics and Gynecology | Admitting: Obstetrics and Gynecology

## 2012-07-11 ENCOUNTER — Inpatient Hospital Stay (HOSPITAL_COMMUNITY): Payer: Medicaid Other | Admitting: Anesthesiology

## 2012-07-11 ENCOUNTER — Encounter (HOSPITAL_COMMUNITY): Payer: Self-pay | Admitting: Anesthesiology

## 2012-07-11 ENCOUNTER — Encounter (HOSPITAL_COMMUNITY): Payer: Self-pay | Admitting: Family Medicine

## 2012-07-11 DIAGNOSIS — O9903 Anemia complicating the puerperium: Secondary | ICD-10-CM | POA: Diagnosis not present

## 2012-07-11 DIAGNOSIS — D6489 Other specified anemias: Secondary | ICD-10-CM | POA: Diagnosis not present

## 2012-07-11 LAB — CBC
MCHC: 34.4 g/dL (ref 30.0–36.0)
RDW: 13.4 % (ref 11.5–15.5)

## 2012-07-11 MED ORDER — LIDOCAINE HCL (PF) 1 % IJ SOLN
30.0000 mL | INTRAMUSCULAR | Status: DC | PRN
  Filled 2012-07-11 (×2): qty 30

## 2012-07-11 MED ORDER — EPHEDRINE 5 MG/ML INJ
10.0000 mg | INTRAVENOUS | Status: DC | PRN
  Filled 2012-07-11: qty 2

## 2012-07-11 MED ORDER — LACTATED RINGERS IV SOLN
INTRAVENOUS | Status: DC
  Administered 2012-07-11: via INTRAVENOUS

## 2012-07-11 MED ORDER — OXYTOCIN 40 UNITS IN LACTATED RINGERS INFUSION - SIMPLE MED
62.5000 mL/h | INTRAVENOUS | Status: DC
  Filled 2012-07-11: qty 1000

## 2012-07-11 MED ORDER — EPHEDRINE 5 MG/ML INJ
10.0000 mg | INTRAVENOUS | Status: DC | PRN
  Filled 2012-07-11: qty 2
  Filled 2012-07-11: qty 4

## 2012-07-11 MED ORDER — DIPHENHYDRAMINE HCL 50 MG/ML IJ SOLN: 12.5000 mg | INTRAMUSCULAR | Status: DC | PRN

## 2012-07-11 MED ORDER — ONDANSETRON HCL 4 MG/2ML IJ SOLN: 4.0000 mg | Freq: Four times a day (QID) | INTRAMUSCULAR | Status: DC | PRN

## 2012-07-11 MED ORDER — FENTANYL 2.5 MCG/ML BUPIVACAINE 1/10 % EPIDURAL INFUSION (WH - ANES)
14.0000 mL/h | INTRAMUSCULAR | Status: DC | PRN
  Filled 2012-07-11: qty 125

## 2012-07-11 MED ORDER — PHENYLEPHRINE 40 MCG/ML (10ML) SYRINGE FOR IV PUSH (FOR BLOOD PRESSURE SUPPORT)
80.0000 ug | PREFILLED_SYRINGE | INTRAVENOUS | Status: DC | PRN
  Filled 2012-07-11: qty 5
  Filled 2012-07-11: qty 2

## 2012-07-11 MED ORDER — OXYTOCIN BOLUS FROM INFUSION
500.0000 mL | INTRAVENOUS | Status: DC
  Administered 2012-07-12: 500 mL via INTRAVENOUS

## 2012-07-11 MED ORDER — LIDOCAINE HCL (PF) 1 % IJ SOLN
INTRAMUSCULAR | Status: DC | PRN
  Administered 2012-07-11 (×2): 4 mL

## 2012-07-11 MED ORDER — IBUPROFEN 600 MG PO TABS
600.0000 mg | ORAL_TABLET | Freq: Four times a day (QID) | ORAL | Status: DC | PRN
  Administered 2012-07-12: 600 mg via ORAL
  Filled 2012-07-11: qty 1

## 2012-07-11 MED ORDER — CITRIC ACID-SODIUM CITRATE 334-500 MG/5ML PO SOLN: 30.0000 mL | ORAL | Status: DC | PRN

## 2012-07-11 MED ORDER — OXYCODONE-ACETAMINOPHEN 5-325 MG PO TABS: 1.0000 | ORAL_TABLET | ORAL | Status: DC | PRN

## 2012-07-11 MED ORDER — FENTANYL 2.5 MCG/ML BUPIVACAINE 1/10 % EPIDURAL INFUSION (WH - ANES)
INTRAMUSCULAR | Status: DC | PRN
  Administered 2012-07-11: 14 mL/h via EPIDURAL

## 2012-07-11 MED ORDER — NALBUPHINE SYRINGE 5 MG/0.5 ML
5.0000 mg | INJECTION | INTRAMUSCULAR | Status: DC | PRN
  Filled 2012-07-11: qty 0.5

## 2012-07-11 MED ORDER — LACTATED RINGERS IV SOLN: 500.0000 mL | INTRAVENOUS | Status: DC | PRN

## 2012-07-11 MED ORDER — PHENYLEPHRINE 40 MCG/ML (10ML) SYRINGE FOR IV PUSH (FOR BLOOD PRESSURE SUPPORT)
80.0000 ug | PREFILLED_SYRINGE | INTRAVENOUS | Status: DC | PRN
  Filled 2012-07-11: qty 2

## 2012-07-11 MED ORDER — ACETAMINOPHEN 325 MG PO TABS: 650.0000 mg | ORAL_TABLET | ORAL | Status: DC | PRN

## 2012-07-11 MED ORDER — LACTATED RINGERS IV SOLN
500.0000 mL | Freq: Once | INTRAVENOUS | Status: AC
  Administered 2012-07-11: 500 mL via INTRAVENOUS

## 2012-07-11 MED ORDER — FLEET ENEMA 7-19 GM/118ML RE ENEM: 1.0000 | ENEMA | RECTAL | Status: DC | PRN

## 2012-07-11 NOTE — H&P (Signed)
22 y.o. [redacted]w[redacted]d  G1P0 comes in c/o SROM clear at 2100.  Otherwise has good fetal movement and no bleeding.  No past medical history on file. No past surgical history on file.  OB History   Grav Para Term Preterm Abortions TAB SAB Ect Mult Living   1              # Outc Date GA Lbr Len/2nd Wgt Sex Del Anes PTL Lv   1 CUR               History   Social History  . Marital Status: Single    Spouse Name: N/A    Number of Children: N/A  . Years of Education: N/A   Occupational History  . Not on file.   Social History Main Topics  . Smoking status: Never Smoker   . Smokeless tobacco: Not on file  . Alcohol Use: No     Comment: socialy  . Drug Use: No  . Sexually Active: Yes    Birth Control/ Protection: Condom   Other Topics Concern  . Not on file   Social History Narrative  . No narrative on file   Penicillins    Prenatal Transfer Tool  Maternal Diabetes: No Genetic Screening: Normal Maternal Ultrasounds/Referrals: Normal Fetal Ultrasounds or other Referrals:  None Maternal Substance Abuse:  No Significant Maternal Medications:  None Significant Maternal Lab Results: None  Other NWG:[id number].    There were no vitals filed for this visit.   Lungs/Cor:  NAD Abdomen:  soft, gravid Ex:  no cords, erythema SVE:  4-5/C/-2 per nurse. FHTs:  120s , good STV, NST R Toco:  q3-5   A/P   Term labor, SROM.  GBS neg.  Tammey Deeg A

## 2012-07-11 NOTE — Anesthesia Procedure Notes (Signed)
Epidural Patient location during procedure: OB Start time: 07/11/2012 11:28 PM  Staffing Anesthesiologist: Madolin Twaddle A. Performed by: anesthesiologist   Preanesthetic Checklist Completed: patient identified, site marked, surgical consent, pre-op evaluation, timeout performed, IV checked, risks and benefits discussed and monitors and equipment checked  Epidural Patient position: sitting Prep: site prepped and draped and DuraPrep Patient monitoring: continuous pulse ox and blood pressure Approach: midline Injection technique: LOR air  Needle:  Needle type: Tuohy  Needle gauge: 17 G Needle length: 9 cm and 9 Needle insertion depth: 4 cm Catheter type: closed end flexible Catheter size: 19 Gauge Catheter at skin depth: 9 cm Test dose: negative and Other  Assessment Events: blood not aspirated, injection not painful, no injection resistance, negative IV test and no paresthesia  Additional Notes Patient identified. Risks and benefits discussed including failed block, incomplete  Pain control, post dural puncture headache, nerve damage, paralysis, blood pressure Changes, nausea, vomiting, reactions to medications-both toxic and allergic and post Partum back pain. All questions were answered. Patient expressed understanding and wished to proceed. Sterile technique was used throughout procedure. Epidural site was Dressed with sterile barrier dressing. No paresthesias, signs of intravascular injection Or signs of intrathecal spread were encountered.  Patient was more comfortable after the epidural was dosed. Please see RN's note for documentation of vital signs and FHR which are stable.

## 2012-07-11 NOTE — MAU Note (Signed)
Pt states she has been having U/C since 1500 and SROM at 2100 with clear fluid and some vaginal bloody show.  Good fetal movement.

## 2012-07-11 NOTE — Anesthesia Preprocedure Evaluation (Signed)
Anesthesia Evaluation  Patient identified by MRN, date of birth, ID band Patient awake    Reviewed: Allergy & Precautions, H&P , Patient's Chart, lab work & pertinent test results  Airway Mallampati: III TM Distance: >3 FB Neck ROM: Full    Dental no notable dental hx. (+) Teeth Intact   Pulmonary neg pulmonary ROS,    Pulmonary exam normal       Cardiovascular negative cardio ROS  Rhythm:Regular Rate:Normal     Neuro/Psych negative neurological ROS  negative psych ROS   GI/Hepatic negative GI ROS, Neg liver ROS,   Endo/Other  negative endocrine ROS  Renal/GU negative Renal ROS  negative genitourinary   Musculoskeletal negative musculoskeletal ROS (+)   Abdominal   Peds  Hematology negative hematology ROS (+)   Anesthesia Other Findings   Reproductive/Obstetrics (+) Pregnancy                           Anesthesia Physical Anesthesia Plan  ASA: II  Anesthesia Plan: Epidural   Post-op Pain Management:    Induction:   Airway Management Planned: Natural Airway  Additional Equipment:   Intra-op Plan:   Post-operative Plan:   Informed Consent: I have reviewed the patients History and Physical, chart, labs and discussed the procedure including the risks, benefits and alternatives for the proposed anesthesia with the patient or authorized representative who has indicated his/her understanding and acceptance.   Dental advisory given  Plan Discussed with: Anesthesiologist  Anesthesia Plan Comments:         Anesthesia Quick Evaluation

## 2012-07-12 ENCOUNTER — Encounter (HOSPITAL_COMMUNITY): Payer: Self-pay | Admitting: Family Medicine

## 2012-07-12 LAB — RPR: RPR Ser Ql: NONREACTIVE

## 2012-07-12 LAB — CBC
HCT: 25.4 % — ABNORMAL LOW (ref 36.0–46.0)
MCHC: 34.3 g/dL (ref 30.0–36.0)

## 2012-07-12 MED ORDER — PRENATAL MULTIVITAMIN CH
1.0000 | ORAL_TABLET | Freq: Every day | ORAL | Status: DC
  Administered 2012-07-12: 1 via ORAL
  Filled 2012-07-12: qty 1

## 2012-07-12 MED ORDER — FERROUS SULFATE 325 (65 FE) MG PO TABS
325.0000 mg | ORAL_TABLET | Freq: Two times a day (BID) | ORAL | Status: DC
  Administered 2012-07-12 – 2012-07-13 (×3): 325 mg via ORAL
  Filled 2012-07-12 (×3): qty 1

## 2012-07-12 MED ORDER — METHYLERGONOVINE MALEATE 0.2 MG PO TABS: 0.2000 mg | ORAL_TABLET | ORAL | Status: DC | PRN

## 2012-07-12 MED ORDER — SODIUM CHLORIDE 0.9 % IJ SOLN: 3.0000 mL | Freq: Two times a day (BID) | INTRAMUSCULAR | Status: DC

## 2012-07-12 MED ORDER — ONDANSETRON HCL 4 MG PO TABS: 4.0000 mg | ORAL_TABLET | ORAL | Status: DC | PRN

## 2012-07-12 MED ORDER — ZOLPIDEM TARTRATE 5 MG PO TABS: 5.0000 mg | ORAL_TABLET | Freq: Every evening | ORAL | Status: DC | PRN

## 2012-07-12 MED ORDER — ONDANSETRON HCL 4 MG/2ML IJ SOLN: 4.0000 mg | INTRAMUSCULAR | Status: DC | PRN

## 2012-07-12 MED ORDER — MAGNESIUM HYDROXIDE 400 MG/5ML PO SUSP: 30.0000 mL | ORAL | Status: DC | PRN

## 2012-07-12 MED ORDER — IBUPROFEN 800 MG PO TABS
800.0000 mg | ORAL_TABLET | Freq: Three times a day (TID) | ORAL | Status: DC
  Administered 2012-07-12 – 2012-07-13 (×4): 800 mg via ORAL
  Filled 2012-07-12 (×4): qty 1

## 2012-07-12 MED ORDER — DIBUCAINE 1 % RE OINT: 1.0000 "application " | TOPICAL_OINTMENT | RECTAL | Status: DC | PRN

## 2012-07-12 MED ORDER — BENZOCAINE-MENTHOL 20-0.5 % EX AERO
1.0000 "application " | INHALATION_SPRAY | CUTANEOUS | Status: DC | PRN
  Administered 2012-07-12: 1 via TOPICAL
  Filled 2012-07-12: qty 56

## 2012-07-12 MED ORDER — WITCH HAZEL-GLYCERIN EX PADS: 1.0000 "application " | MEDICATED_PAD | CUTANEOUS | Status: DC | PRN

## 2012-07-12 MED ORDER — MEASLES, MUMPS & RUBELLA VAC ~~LOC~~ INJ
0.5000 mL | INJECTION | Freq: Once | SUBCUTANEOUS | Status: DC
  Filled 2012-07-12: qty 0.5

## 2012-07-12 MED ORDER — TETANUS-DIPHTH-ACELL PERTUSSIS 5-2.5-18.5 LF-MCG/0.5 IM SUSP
0.5000 mL | Freq: Once | INTRAMUSCULAR | Status: AC
  Administered 2012-07-12: 0.5 mL via INTRAMUSCULAR

## 2012-07-12 MED ORDER — SENNOSIDES-DOCUSATE SODIUM 8.6-50 MG PO TABS
2.0000 | ORAL_TABLET | Freq: Every day | ORAL | Status: DC
  Administered 2012-07-12: 2 via ORAL

## 2012-07-12 MED ORDER — METHYLERGONOVINE MALEATE 0.2 MG/ML IJ SOLN: 0.2000 mg | INTRAMUSCULAR | Status: DC | PRN

## 2012-07-12 MED ORDER — SODIUM CHLORIDE 0.9 % IJ SOLN: 3.0000 mL | INTRAMUSCULAR | Status: DC | PRN

## 2012-07-12 MED ORDER — OXYCODONE-ACETAMINOPHEN 5-325 MG PO TABS
1.0000 | ORAL_TABLET | ORAL | Status: DC | PRN
  Administered 2012-07-12 (×2): 1 via ORAL
  Filled 2012-07-12 (×2): qty 1

## 2012-07-12 MED ORDER — DIPHENHYDRAMINE HCL 25 MG PO CAPS: 25.0000 mg | ORAL_CAPSULE | Freq: Four times a day (QID) | ORAL | Status: DC | PRN

## 2012-07-12 MED ORDER — SIMETHICONE 80 MG PO CHEW: 80.0000 mg | CHEWABLE_TABLET | ORAL | Status: DC | PRN

## 2012-07-12 MED ORDER — SODIUM CHLORIDE 0.9 % IV SOLN: 250.0000 mL | INTRAVENOUS | Status: DC | PRN

## 2012-07-12 MED ORDER — LANOLIN HYDROUS EX OINT: TOPICAL_OINTMENT | CUTANEOUS | Status: DC | PRN

## 2012-07-12 NOTE — Lactation Note (Signed)
This note was copied from the chart of Erica Allayna Erlich. Lactation Consultation Note  Patient Name: Erica Webb [id number] Date: 07/12/2012 Reason for consult: Initial assessment   Maternal Data Formula Feeding for Exclusion: No Infant to breast within first hour of birth: Yes Does the patient have breastfeeding experience prior to this delivery?: No  Feeding    LATCH Score/Interventions                      Lactation Tools Discussed/Used     Consult Status Consult Status: Follow-up Date: 07/13/12 Follow-up type: In-patient  Mom reports that baby nursed well after delivery but has been sleepy this morning. Reports that she just tried to nurse but he is too sleepy. Reviewed feeding cues and encouraged to feed whenever she sees them. Reports that it hurt some while he was nursing- reviewed wide open mouth and having the baby deep onto the breast and keeping the baby close throughout the feeding. BF brochure given with resources for support after DC. To call for assist prn. Encouraged skin too skin as much as possible today.No questions at present.   Pamelia Hoit 07/12/2012, 10:29 AM

## 2012-07-12 NOTE — Anesthesia Postprocedure Evaluation (Signed)
Anesthesia Post Note  Patient: Erica Webb  Procedure(s) Performed: * No procedures listed *  Anesthesia type: Epidural  Patient location: Mother/Baby  Post pain: Pain level controlled  Post assessment: Post-op Vital signs reviewed  Last Vitals:  Filed Vitals:   07/12/12 0415  BP: 105/66  Pulse: 105  Temp: 36.8 C  Resp: 18    Post vital signs: Reviewed  Level of consciousness:alert  Complications: No apparent anesthesia complications  

## 2012-07-12 NOTE — Anesthesia Postprocedure Evaluation (Signed)
Anesthesia Post Note  Patient: Erica Webb  Procedure(s) Performed: * No procedures listed *  Anesthesia type: Epidural  Patient location: Mother/Baby  Post pain: Pain level controlled  Post assessment: Post-op Vital signs reviewed  Last Vitals:  Filed Vitals:   07/12/12 0415  BP: 105/66  Pulse: 105  Temp: 36.8 C  Resp: 18    Post vital signs: Reviewed  Level of consciousness:alert  Complications: No apparent anesthesia complications

## 2012-07-12 NOTE — Progress Notes (Signed)
Post Partum Day 0 Subjective: no complaints, up ad lib, voiding and tolerating PO  Objective: Blood pressure 109/73, pulse 92, temperature 97.6 F (36.4 C), temperature source Oral, resp. rate 16, height 5\' 7"  (1.702 m), weight 81.194 kg (179 lb), last menstrual period 09/11/2011, SpO2 100.00%, unknown if currently breastfeeding.  Physical Exam:  General: alert, cooperative and appears stated age Lochia: appropriate Uterine Fundus: firm    Recent Labs  07/11/12 2215 07/12/12 0610  HGB 12.1 8.7*  HCT 35.2* 25.4*    Assessment/Plan: Breastfeeding Routine care   LOS: 1 day   Telesforo Brosnahan H. 07/12/2012, 10:20 AM

## 2012-07-13 MED ORDER — DOCUSATE SODIUM 100 MG PO CAPS: 100.0000 mg | ORAL_CAPSULE | Freq: Two times a day (BID) | ORAL | Status: DC

## 2012-07-13 MED ORDER — OXYCODONE-ACETAMINOPHEN 5-325 MG PO TABS: 2.0000 | ORAL_TABLET | ORAL | Status: DC | PRN

## 2012-07-13 MED ORDER — IBUPROFEN 600 MG PO TABS: 600.0000 mg | ORAL_TABLET | Freq: Four times a day (QID) | ORAL | Status: DC | PRN

## 2012-07-15 ENCOUNTER — Inpatient Hospital Stay (HOSPITAL_COMMUNITY): Admission: RE | Admit: 2012-07-15 | Payer: Medicaid Other | Source: Ambulatory Visit

## 2012-07-15 NOTE — Discharge Summary (Signed)
Obstetric Discharge Summary Reason for Admission: Labor Prenatal Procedures: Ultrasound Intrapartum Procedures: None Postpartum Procedures: None Complications-Operative and Postpartum: 1st degree Hemoglobin  Date Value Range Status  07/12/2012 8.7* 12.0 - 15.0 g/dL Final     DELTA CHECK NOTED     REPEATED TO VERIFY     HCT  Date Value Range Status  07/12/2012 25.4* 36.0 - 46.0 % Final    Physical Exam:  General: AOx3 Lochia: Appropriate Uterine Fundus: firm   Discharge Diagnoses: Term pregnancy delivered, Anemia  Discharge Information: Date: 07/15/2012 Activity: Pelvic rest Diet: Routine Medications: Percocet, colace, ibuprofen Condition: Improved Instructions: Per practice booklet Discharge to: Home   Newborn Data: Live born female  Birth Weight: 7 lb 11 oz (3487 g) APGAR: 7, 9  Home with Mother  Erica Webb. 07/15/2012, 2:38 PM

## 2013-02-06 NOTE — L&D Delivery Note (Signed)
Delivery Note At 5:26 AM a viable and healthy female was delivered via Vaginal, Spontaneous Delivery (Presentation: ; Occiput Anterior).  APGAR: 9, 9; weight 7 lb 3.8 oz (3283 g).   Placenta status: Intact, Spontaneous.  Cord: 3 vessels with the following complications:  Anesthesia: Epidural  Episiotomy: None Lacerations: None Suture Repair: n/a Est. Blood Loss (mL):   Mom to postpartum.  Baby to Couplet care / Skin to Skin.  Biviana Saddler 07/12/2013, 7:33 AM

## 2013-02-17 LAB — OB RESULTS CONSOLE ABO/RH: RH Type: POSITIVE

## 2013-02-17 LAB — OB RESULTS CONSOLE GC/CHLAMYDIA: GC PROBE AMP, GENITAL: NEGATIVE

## 2013-02-17 LAB — OB RESULTS CONSOLE RPR: RPR: NONREACTIVE

## 2013-02-17 LAB — OB RESULTS CONSOLE ANTIBODY SCREEN: ANTIBODY SCREEN: NEGATIVE

## 2013-02-17 LAB — OB RESULTS CONSOLE HEPATITIS B SURFACE ANTIGEN: HEP B S AG: NEGATIVE

## 2013-02-17 LAB — OB RESULTS CONSOLE HIV ANTIBODY (ROUTINE TESTING): HIV: NONREACTIVE

## 2013-02-17 LAB — OB RESULTS CONSOLE RUBELLA ANTIBODY, IGM: Rubella: NON-IMMUNE/NOT IMMUNE

## 2013-02-20 LAB — OB RESULTS CONSOLE GC/CHLAMYDIA: Chlamydia: NEGATIVE

## 2013-06-12 ENCOUNTER — Encounter (HOSPITAL_COMMUNITY): Payer: Self-pay | Admitting: *Deleted

## 2013-06-12 ENCOUNTER — Inpatient Hospital Stay (HOSPITAL_COMMUNITY)
Admission: AD | Admit: 2013-06-12 | Discharge: 2013-06-12 | Disposition: A | Discharge status: Home / Self Care | Payer: Self-pay | Source: Ambulatory Visit | Attending: Obstetrics & Gynecology | Admitting: Obstetrics & Gynecology

## 2013-06-12 DIAGNOSIS — O479 False labor, unspecified: Secondary | ICD-10-CM

## 2013-06-12 DIAGNOSIS — Z833 Family history of diabetes mellitus: Secondary | ICD-10-CM | POA: Insufficient documentation

## 2013-06-12 DIAGNOSIS — O4700 False labor before 37 completed weeks of gestation, unspecified trimester: Secondary | ICD-10-CM | POA: Insufficient documentation

## 2013-06-12 HISTORY — DX: Other specified health status: Z78.9

## 2013-06-12 LAB — URINALYSIS, ROUTINE W REFLEX MICROSCOPIC
Bilirubin Urine: NEGATIVE
Glucose, UA: NEGATIVE mg/dL
Hgb urine dipstick: NEGATIVE
Ketones, ur: NEGATIVE mg/dL
LEUKOCYTES UA: NEGATIVE
NITRITE: NEGATIVE
PH: 6 (ref 5.0–8.0)
Protein, ur: NEGATIVE mg/dL
SPECIFIC GRAVITY, URINE: 1.015 (ref 1.005–1.030)
Urobilinogen, UA: 0.2 mg/dL (ref 0.0–1.0)

## 2013-06-12 LAB — FETAL FIBRONECTIN: Fetal Fibronectin: NEGATIVE

## 2013-06-12 LAB — GROUP B STREP BY PCR: GROUP B STREP BY PCR: NEGATIVE

## 2013-06-12 MED ORDER — BETAMETHASONE SOD PHOS & ACET 6 (3-3) MG/ML IJ SUSP
12.0000 mg | Freq: Once | INTRAMUSCULAR | Status: AC
  Administered 2013-06-12: 12 mg via INTRAMUSCULAR
  Filled 2013-06-12: qty 2

## 2013-06-12 NOTE — MAU Note (Signed)
PT SAYS SHE WENT TO OFFICE  FOR REG VISIT-  SAYS HAS BEEN HAVING UC'S  ALL  DAY-  COLLECTED FFN.  AND UA    VE   BY DR ROSS-  3 CM.    LAST SEX-   4-30.     DENIES HSV AND   MRSA.

## 2013-06-12 NOTE — MAU Provider Note (Signed)
  History     CSN: [id number]  Arrival date and time: 06/12/13 1713   None     No chief complaint on file.  HPI This is a 23 y.o. female at 2289w6d who presents from the office for monitoring due to preterm contractions with cervical change. FFn was collected at the office. Pt states UCs hurt more earlier in the day than they do now. States does not feel most of them now.   RN Note:  PT SAYS SHE WENT TO OFFICE FOR REG VISIT- SAYS HAS BEEN HAVING UC'S ALL DAY- COLLECTED FFN. AND UA VE BY DR ROSS- 3 CM. LAST SEX- 4-30. DENIES HSV AND MRSA.        OB History   Grav Para Term Preterm Abortions TAB SAB Ect Mult Living   2 1 1       1       No past medical history on file.  No past surgical history on file.  Family History  Problem Relation Age of Onset  . Diabetes Other     History  Substance Use Topics  . Smoking status: Never Smoker   . Smokeless tobacco: Not on file  . Alcohol Use: No     Comment: socialy    Allergies:  Allergies  Allergen Reactions  . Penicillins Anaphylaxis    Pt repots laryngeal edema    Prescriptions prior to admission  Medication Sig Dispense Refill  . acetaminophen (TYLENOL) 325 MG tablet Take 650 mg by mouth every 6 (six) hours as needed for mild pain.      . calcium carbonate (TUMS - DOSED IN MG ELEMENTAL CALCIUM) 500 MG chewable tablet Chew 1-2 tablets by mouth daily.      . Prenatal Vit-Fe Fumarate-FA (PRENATAL MULTIVITAMIN) TABS tablet Take 1 tablet by mouth daily at 12 noon.        Review of Systems  Constitutional: Negative for fever, chills and malaise/fatigue.  Gastrointestinal: Negative for nausea, vomiting and abdominal pain.  Neurological: Negative for dizziness.   Physical Exam   Blood pressure 110/59, pulse 99, temperature 98.4 F (36.9 C), temperature source Oral, resp. rate 20, height 5\' 5"  (1.651 m), weight 70.421 kg (155 lb 4 oz), unknown if currently breastfeeding.  Physical Exam  Constitutional: She is oriented to  person, place, and time. She appears well-developed and well-nourished. No distress.  HENT:  Head: Normocephalic.  Cardiovascular: Normal rate.   Respiratory: Effort normal.  GI: Soft. There is no tenderness. There is no rebound and no guarding.  Genitourinary: Vagina normal and uterus normal. No vaginal discharge found.  Musculoskeletal: Normal range of motion.  Neurological: She is alert and oriented to person, place, and time.  Skin: Skin is warm and dry.  Psychiatric: She has a normal mood and affect.   FHR reactive Mild contractions every 3 minutes.   MAU Course  Procedures  MDM Discussed with DR Mora ApplPinn. Will offer admission for obs > pt declined. Will watch for an hour and recheck. Will give Betamethasone series.  2008: Cervix: unchanged 3/50/-3 GBS collected  Assessment and Plan  A:  SIUP at 7089w6d       Preterm contractions with neg FFn but cervical dilation  P:  S/P Betamethasone.        Report to Zorita PangH Sabien Umland CNM Will return tomorrow for 2nd dose of BMZ and check in PTL precautions Return to MAU sooner than 24 hours if needed Fetal kick counts  Erica Webb 06/12/2013, 6:17 PM

## 2013-06-12 NOTE — Discharge Instructions (Signed)
Preterm Labor Information Preterm labor is when labor starts at less than 37 weeks of pregnancy. The normal length of a pregnancy is 39 to 41 weeks. CAUSES Often, there is no identifiable underlying cause as to why a woman goes into preterm labor. One of the most common known causes of preterm labor is infection. Infections of the uterus, cervix, vagina, amniotic sac, bladder, kidney, or even the lungs (pneumonia) can cause labor to start. Other suspected causes of preterm labor include:   Urogenital infections, such as yeast infections and bacterial vaginosis.   Uterine abnormalities (uterine shape, uterine septum, fibroids, or bleeding from the placenta).   A cervix that has been operated on (it may fail to stay closed).   Malformations in the fetus.   Multiple gestations (twins, triplets, and so on).   Breakage of the amniotic sac.  RISK FACTORS  Having a previous history of preterm labor.   Having premature rupture of membranes (PROM).   Having a placenta that covers the opening of the cervix (placenta previa).   Having a placenta that separates from the uterus (placental abruption).   Having a cervix that is too weak to hold the fetus in the uterus (incompetent cervix).   Having too much fluid in the amniotic sac (polyhydramnios).   Taking illegal drugs or smoking while pregnant.   Not gaining enough weight while pregnant.   Being younger than 18 and older than 23 years old.   Having a low socioeconomic status.   Being African American. SYMPTOMS Signs and symptoms of preterm labor include:   Menstrual-like cramps, abdominal pain, or back pain.  Uterine contractions that are regular, as frequent as six in an hour, regardless of their intensity (may be mild or painful).  Contractions that start on the top of the uterus and spread down to the lower abdomen and back.   A sense of increased pelvic pressure.   A watery or bloody mucus discharge that  comes from the vagina.  TREATMENT Depending on the length of the pregnancy and other circumstances, your health care provider may suggest bed rest. If necessary, there are medicines that can be given to stop contractions and to mature the fetal lungs. If labor happens before 34 weeks of pregnancy, a prolonged hospital stay may be recommended. Treatment depends on the condition of both you and the fetus.  WHAT SHOULD YOU DO IF YOU THINK YOU ARE IN PRETERM LABOR? Call your health care provider right away. You will need to go to the hospital to get checked immediately. HOW CAN YOU PREVENT PRETERM LABOR IN FUTURE PREGNANCIES? You should:   Stop smoking if you smoke.  Maintain healthy weight gain and avoid chemicals and drugs that are not necessary.  Be watchful for any type of infection.  Inform your health care provider if you have a known history of preterm labor. Document Released: 04/15/2003 Document Revised: 09/25/2012 Document Reviewed: 02/26/2012 [employer] Patient Information 2014 [employer].    

## 2013-06-12 NOTE — MAU Note (Signed)
Urine in lab 

## 2013-06-13 ENCOUNTER — Inpatient Hospital Stay (HOSPITAL_COMMUNITY)
Admission: AD | Admit: 2013-06-13 | Discharge: 2013-06-13 | Disposition: A | Discharge status: Home / Self Care | Payer: BC Managed Care – PPO | Source: Ambulatory Visit | Attending: Obstetrics and Gynecology | Admitting: Obstetrics and Gynecology

## 2013-06-13 ENCOUNTER — Encounter (HOSPITAL_COMMUNITY): Payer: Self-pay | Admitting: *Deleted

## 2013-06-13 DIAGNOSIS — O4700 False labor before 37 completed weeks of gestation, unspecified trimester: Secondary | ICD-10-CM | POA: Insufficient documentation

## 2013-06-13 DIAGNOSIS — Z880 Allergy status to penicillin: Secondary | ICD-10-CM | POA: Insufficient documentation

## 2013-06-13 MED ORDER — NIFEDIPINE 10 MG PO CAPS
10.0000 mg | ORAL_CAPSULE | Freq: Once | ORAL | Status: AC
  Administered 2013-06-13: 10 mg via ORAL

## 2013-06-13 MED ORDER — BETAMETHASONE SOD PHOS & ACET 6 (3-3) MG/ML IJ SUSP
12.0000 mg | Freq: Once | INTRAMUSCULAR | Status: AC
  Administered 2013-06-13: 12 mg via INTRAMUSCULAR
  Filled 2013-06-13: qty 2

## 2013-06-13 MED ORDER — NIFEDIPINE 10 MG PO CAPS
10.0000 mg | ORAL_CAPSULE | Freq: Once | ORAL | Status: AC
  Administered 2013-06-13: 10 mg via ORAL
  Filled 2013-06-13: qty 1

## 2013-06-13 NOTE — MAU Provider Note (Signed)
  History     CSN: [id number]  Arrival date and time: 06/13/13 1809   None     Chief Complaint  Patient presents with  . Labor Eval   HPI  Ms. Erica Webb is a 23 y.o. female G2P1001 at 3833w0d who presents for a second dose of betamethasone. She complains of contractions every 3 minutes. She was here yesterday for evaluation of preterm labor; fetal fibronectin was negative. She reports good fetal movement, denies LOF, vaginal bleeding, vaginal itching/burning, urinary symptoms, h/a, dizziness, n/v, or fever/chills.     OB History   Grav Para Term Preterm Abortions TAB SAB Ect Mult Living   2 1 1       1       Past Medical History  Diagnosis Date  . Medical history non-contributory     Past Surgical History  Procedure Laterality Date  . Wisdom tooth extraction      Family History  Problem Relation Age of Onset  . Diabetes Other     History  Substance Use Topics  . Smoking status: Never Smoker   . Smokeless tobacco: Not on file  . Alcohol Use: No     Comment: socialy    Allergies:  Allergies  Allergen Reactions  . Penicillins Anaphylaxis    Pt repots laryngeal edema    Prescriptions prior to admission  Medication Sig Dispense Refill  . acetaminophen (TYLENOL) 325 MG tablet Take 650 mg by mouth every 6 (six) hours as needed for mild pain.      . calcium carbonate (TUMS - DOSED IN MG ELEMENTAL CALCIUM) 500 MG chewable tablet Chew 1-2 tablets by mouth daily.      . Prenatal Vit-Fe Fumarate-FA (PRENATAL MULTIVITAMIN) TABS tablet Take 1 tablet by mouth daily at 12 noon.        Review of Systems  Constitutional: Negative for fever and chills.  Gastrointestinal: Positive for abdominal pain (+ lower abdominal cramping ). Negative for nausea and vomiting.   Physical Exam   Blood pressure 119/67, pulse 97, temperature 99 F (37.2 C), temperature source Oral, resp. rate 16, unknown if currently breastfeeding.  Physical Exam  Constitutional: She is oriented  to person, place, and time. She appears well-developed and well-nourished. No distress.  HENT:  Head: Normocephalic.  Eyes: Pupils are equal, round, and reactive to light.  Neck: Neck supple.  Respiratory: Effort normal.  GI: Soft. She exhibits no distension. There is no tenderness. There is no rebound.  Musculoskeletal: Normal range of motion.  Neurological: She is alert and oriented to person, place, and time.  Skin: Skin is warm. She is not diaphoretic.  Psychiatric: Her behavior is normal.   Dilation: 3.5 Effacement (%): 80 Cervical Position: Posterior Station: -3 Presentation: Vertex Exam by:: Dorrene GermanJ. Lowe RN  Fetal Tracing: Baseline:120 bpm  Variability: Moderate  Accelerations: 15x15 Decelerations: none Toco: contractions 3-5 mins apart.    MAU Course  Procedures None  MDM 2nd dose of betamethasone given NST Consulted with Dr. Henderson CloudHorvath. Give patient 1-2 doses of procardia; if patient feels the medication has helped and contractions slow down, ok to discharge patient home, no need to recheck her cervix unless contractions intensify.   Assessment and Plan  Report given to M. Mayford KnifeWilliams CNM who resumes care of the patient   [name], NP  06/13/2013, 7:30 PM   Second dose of procardia given with lessening of contractions Patient states feels better Labor precautions Discharge home Erica Webb, CNM

## 2013-06-13 NOTE — MAU Note (Signed)
Pt here for 2nd betamethasone, states she has continued to have uc's ever since she left yesterday, denies bleeding or LOF.  Pt refuses to be placed on EFM @ this time, states she only wants her cervix checked.

## 2013-06-13 NOTE — MAU Note (Signed)
Explained to pt that we need 20 minutes of monitoring for NST to determine fetal well-being, pt agrees, EFM applied.

## 2013-06-13 NOTE — MAU Provider Note (Signed)
Reviewed case with CNM and I agree with assessment and plan  [facility] Amelianna Webb

## 2013-06-13 NOTE — MAU Note (Signed)
Patient states she is having contractions every 3 minutes and has continued since being evaluated yesterday. Her for 2nd Betamethasone injection.

## 2013-06-20 ENCOUNTER — Other Ambulatory Visit: Payer: Self-pay

## 2013-06-20 LAB — OB RESULTS CONSOLE GBS: STREP GROUP B AG: NEGATIVE

## 2013-07-11 ENCOUNTER — Inpatient Hospital Stay (HOSPITAL_COMMUNITY)
Admission: AD | Admit: 2013-07-11 | Discharge: 2013-07-13 | DRG: 775 | Disposition: A | Discharge status: Home / Self Care | Payer: BC Managed Care – PPO | Source: Ambulatory Visit | Attending: Obstetrics & Gynecology | Admitting: Obstetrics & Gynecology

## 2013-07-11 ENCOUNTER — Inpatient Hospital Stay (HOSPITAL_COMMUNITY): Payer: BC Managed Care – PPO | Admitting: Anesthesiology

## 2013-07-11 ENCOUNTER — Encounter (HOSPITAL_COMMUNITY): Payer: BC Managed Care – PPO | Admitting: Anesthesiology

## 2013-07-11 ENCOUNTER — Encounter (HOSPITAL_COMMUNITY): Payer: Self-pay | Admitting: General Practice

## 2013-07-11 DIAGNOSIS — IMO0001 Reserved for inherently not codable concepts without codable children: Secondary | ICD-10-CM

## 2013-07-11 DIAGNOSIS — Z833 Family history of diabetes mellitus: Secondary | ICD-10-CM

## 2013-07-11 LAB — CBC
HEMATOCRIT: 31.6 % — AB (ref 36.0–46.0)
HEMOGLOBIN: 10.7 g/dL — AB (ref 12.0–15.0)
MCH: 28.2 pg (ref 26.0–34.0)
MCHC: 33.9 g/dL (ref 30.0–36.0)
MCV: 83.2 fL (ref 78.0–100.0)
Platelets: 191 10*3/uL (ref 150–400)
RBC: 3.8 MIL/uL — AB (ref 3.87–5.11)
RDW: 13.4 % (ref 11.5–15.5)
WBC: 12.1 10*3/uL — AB (ref 4.0–10.5)

## 2013-07-11 MED ORDER — FENTANYL 2.5 MCG/ML BUPIVACAINE 1/10 % EPIDURAL INFUSION (WH - ANES)
14.0000 mL/h | INTRAMUSCULAR | Status: DC | PRN
  Administered 2013-07-12: 14 mL/h via EPIDURAL

## 2013-07-11 MED ORDER — DIPHENHYDRAMINE HCL 50 MG/ML IJ SOLN: 12.5000 mg | INTRAMUSCULAR | Status: DC | PRN

## 2013-07-11 MED ORDER — LIDOCAINE HCL (PF) 1 % IJ SOLN
30.0000 mL | INTRAMUSCULAR | Status: DC | PRN
  Filled 2013-07-11: qty 30

## 2013-07-11 MED ORDER — OXYTOCIN BOLUS FROM INFUSION: 500.0000 mL | INTRAVENOUS | Status: DC

## 2013-07-11 MED ORDER — LACTATED RINGERS IV SOLN: 500.0000 mL | Freq: Once | INTRAVENOUS | Status: DC

## 2013-07-11 MED ORDER — FENTANYL 2.5 MCG/ML BUPIVACAINE 1/10 % EPIDURAL INFUSION (WH - ANES)
INTRAMUSCULAR | Status: AC
  Filled 2013-07-11: qty 125

## 2013-07-11 MED ORDER — OXYCODONE-ACETAMINOPHEN 5-325 MG PO TABS: 1.0000 | ORAL_TABLET | ORAL | Status: DC | PRN

## 2013-07-11 MED ORDER — LACTATED RINGERS IV SOLN: 500.0000 mL | INTRAVENOUS | Status: DC | PRN

## 2013-07-11 MED ORDER — OXYTOCIN 40 UNITS IN LACTATED RINGERS INFUSION - SIMPLE MED
1.0000 m[IU]/min | INTRAVENOUS | Status: DC
Start: 2013-07-11 — End: 2013-07-13
  Administered 2013-07-11: 1 m[IU]/min via INTRAVENOUS
  Filled 2013-07-11: qty 1000

## 2013-07-11 MED ORDER — EPHEDRINE 5 MG/ML INJ
10.0000 mg | INTRAVENOUS | Status: DC | PRN
  Filled 2013-07-11: qty 2

## 2013-07-11 MED ORDER — OXYTOCIN 40 UNITS IN LACTATED RINGERS INFUSION - SIMPLE MED
62.5000 mL/h | INTRAVENOUS | Status: DC
  Administered 2013-07-12: 62.5 mL/h via INTRAVENOUS

## 2013-07-11 MED ORDER — FENTANYL 2.5 MCG/ML BUPIVACAINE 1/10 % EPIDURAL INFUSION (WH - ANES)
INTRAMUSCULAR | Status: DC | PRN
  Administered 2013-07-11: 14 mL/h via EPIDURAL

## 2013-07-11 MED ORDER — CITRIC ACID-SODIUM CITRATE 334-500 MG/5ML PO SOLN: 30.0000 mL | ORAL | Status: DC | PRN

## 2013-07-11 MED ORDER — EPHEDRINE 5 MG/ML INJ
10.0000 mg | INTRAVENOUS | Status: DC | PRN
Start: 2013-07-11 — End: 2013-07-13
  Filled 2013-07-11: qty 2

## 2013-07-11 MED ORDER — PHENYLEPHRINE 40 MCG/ML (10ML) SYRINGE FOR IV PUSH (FOR BLOOD PRESSURE SUPPORT)
80.0000 ug | PREFILLED_SYRINGE | INTRAVENOUS | Status: DC | PRN
  Administered 2013-07-11: 80 ug via INTRAVENOUS
  Filled 2013-07-11: qty 2

## 2013-07-11 MED ORDER — LACTATED RINGERS IV SOLN: INTRAVENOUS | Status: DC

## 2013-07-11 MED ORDER — ONDANSETRON HCL 4 MG/2ML IJ SOLN
4.0000 mg | Freq: Four times a day (QID) | INTRAMUSCULAR | Status: DC | PRN
Start: 2013-07-11 — End: 2013-07-12
  Administered 2013-07-12: 4 mg via INTRAVENOUS
  Filled 2013-07-11: qty 2

## 2013-07-11 MED ORDER — PHENYLEPHRINE 40 MCG/ML (10ML) SYRINGE FOR IV PUSH (FOR BLOOD PRESSURE SUPPORT)
80.0000 ug | PREFILLED_SYRINGE | INTRAVENOUS | Status: DC | PRN
  Filled 2013-07-11: qty 2

## 2013-07-11 MED ORDER — LIDOCAINE HCL (PF) 1 % IJ SOLN
INTRAMUSCULAR | Status: DC | PRN
  Administered 2013-07-11 (×2): 4 mL

## 2013-07-11 MED ORDER — PHENYLEPHRINE 40 MCG/ML (10ML) SYRINGE FOR IV PUSH (FOR BLOOD PRESSURE SUPPORT)
PREFILLED_SYRINGE | INTRAVENOUS | Status: AC
  Filled 2013-07-11: qty 10

## 2013-07-11 MED ORDER — EPHEDRINE 5 MG/ML INJ
INTRAVENOUS | Status: AC
  Filled 2013-07-11: qty 4

## 2013-07-11 MED ORDER — ACETAMINOPHEN 325 MG PO TABS: 650.0000 mg | ORAL_TABLET | ORAL | Status: DC | PRN

## 2013-07-11 MED ORDER — FENTANYL 2.5 MCG/ML BUPIVACAINE 1/10 % EPIDURAL INFUSION (WH - ANES)
14.0000 mL/h | INTRAMUSCULAR | Status: DC | PRN
  Filled 2013-07-11: qty 125

## 2013-07-11 MED ORDER — IBUPROFEN 600 MG PO TABS
600.0000 mg | ORAL_TABLET | Freq: Four times a day (QID) | ORAL | Status: DC | PRN
  Administered 2013-07-12: 600 mg via ORAL
  Filled 2013-07-11: qty 1

## 2013-07-11 NOTE — Anesthesia Procedure Notes (Signed)
Epidural Patient location during procedure: OB Start time: 07/11/2013 11:04 PM End time: 07/11/2013 11:14 PM  Staffing Anesthesiologist: Lewie Loron R Performed by: anesthesiologist   Preanesthetic Checklist Completed: patient identified, pre-op evaluation, timeout performed, IV checked, risks and benefits discussed and monitors and equipment checked  Epidural Patient position: sitting Prep: site prepped and draped and DuraPrep Patient monitoring: heart rate Approach: midline Location: L2-L3 Injection technique: LOR air and LOR saline  Needle:  Needle type: Tuohy  Needle gauge: 17 G Needle length: 9 cm Needle insertion depth: 7 cm Catheter type: closed end flexible Catheter size: 19 Gauge Catheter at skin depth: 13 cm Test dose: negative  Assessment Sensory level: T8 Events: blood not aspirated, injection not painful, no injection resistance, negative IV test and no paresthesia  Additional Notes Reason for block:procedure for pain

## 2013-07-11 NOTE — MAU Note (Signed)
Pt presents to mau with c/o rom. States fluid leaking since 1630 and states is more and more

## 2013-07-11 NOTE — H&P (Signed)
Chantell A Baldassarre is a 23 y.o. female admitted from MAU in early active labor at 6cm.  Maternal Medical History:  Reason for admission: Active labor possible SROM  Contractions: Frequency: irregular.   Perceived severity is mild.    Fetal activity: Perceived fetal activity is normal.   Last perceived fetal movement was within the past 12 hours.    Prenatal complications: no prenatal complications Prenatal Complications - Diabetes: none.    OB History   Grav Para Term Preterm Abortions TAB SAB Ect Mult Living   2 1 1       1      Past Medical History  Diagnosis Date  . Medical history non-contributory    Past Surgical History  Procedure Laterality Date  . Wisdom tooth extraction     Family History: family history includes Diabetes in her other. Social History:  reports that she has never smoked. She does not have any smokeless tobacco history on file. She reports that she does not drink alcohol or use illicit drugs.   Prenatal Transfer Tool  Maternal Diabetes: No Genetic Screening: Normal Maternal Ultrasounds/Referrals: Normal Fetal Ultrasounds or other Referrals:  None Maternal Substance Abuse:  No Significant Maternal Medications:  None Significant Maternal Lab Results:  Lab values include: Group B Strep negative Other Comments:  None  Review of Systems  Constitutional: Negative.  Negative for fever and chills.  HENT: Negative.   Eyes: Negative.  Negative for blurred vision.  Respiratory: Negative.   Cardiovascular: Negative.  Negative for chest pain.  Gastrointestinal: Negative.  Negative for heartburn.  Musculoskeletal: Negative.   Neurological: Negative.  Negative for headaches.    Dilation: 6 Effacement (%): 100 Station: -2 Exam by:: cherl motte, rn Blood pressure 119/66, pulse 95, temperature 98.8 F (37.1 C), temperature source Oral, resp. rate 18, height 5\' 7"  (1.702 m), weight 74.39 kg (164 lb), unknown if currently breastfeeding. Maternal Exam:   Uterine Assessment: Contraction strength is mild.  Contraction frequency is irregular.   Abdomen: Patient reports no abdominal tenderness. Fundal height is 38cm.   Estimated fetal weight is 3100 grams.   Fetal presentation: vertex  Introitus: Normal vulva. Normal vagina.  Ferning test: negative.  Nitrazine test: positive.  Pelvis: adequate for delivery.   Cervix: Cervix evaluated by sterile speculum exam and digital exam.   Patient examined in MAU: no pooling negative fern 6cm / 100/bulging bag  Physical Exam  Constitutional: She is oriented to person, place, and time. She appears well-developed and well-nourished.  Cardiovascular: Normal rate and regular rhythm.   Neurological: She is alert and oriented to person, place, and time. She has normal reflexes.    Prenatal labs: ABO, Rh: A/Positive/-- (01/12 0000) Antibody: Negative (01/12 0000) Rubella: Nonimmune (01/12 0000) RPR: Nonreactive (01/12 0000)  HBsAg: Negative (01/12 0000)  HIV: Non-reactive (01/12 0000)  GBS: Negative (05/15 0000)   Assessment/Plan: SIUP at term in early active labor at 6cm History c/w possible high leak? Although negative Crist Fat  Will start Pitocin for augmentation Epidural on demand Continuous monitoring   Dereka Lueras 07/11/2013, 6:54 PM

## 2013-07-11 NOTE — Anesthesia Preprocedure Evaluation (Signed)
Anesthesia Evaluation  Patient identified by MRN, date of birth, ID band Patient awake    Reviewed: Allergy & Precautions, H&P , Patient's Chart, lab work & pertinent test results  Airway Mallampati: III TM Distance: >3 FB Neck ROM: Full    Dental no notable dental hx. (+) Teeth Intact   Pulmonary neg pulmonary ROS,    Pulmonary exam normal       Cardiovascular negative cardio ROS  Rhythm:Regular Rate:Normal     Neuro/Psych negative neurological ROS  negative psych ROS   GI/Hepatic negative GI ROS, Neg liver ROS,   Endo/Other  negative endocrine ROS  Renal/GU negative Renal ROS     Musculoskeletal negative musculoskeletal ROS (+)   Abdominal   Peds  Hematology negative hematology ROS (+)   Anesthesia Other Findings   Reproductive/Obstetrics (+) Pregnancy                           Anesthesia Physical  Anesthesia Plan  ASA: II  Anesthesia Plan: Epidural   Post-op Pain Management:    Induction:   Airway Management Planned:   Additional Equipment:   Intra-op Plan:   Post-operative Plan:   Informed Consent: I have reviewed the patients History and Physical, chart, labs and discussed the procedure including the risks, benefits and alternatives for the proposed anesthesia with the patient or authorized representative who has indicated his/her understanding and acceptance.     Plan Discussed with:   Anesthesia Plan Comments:         Anesthesia Quick Evaluation

## 2013-07-12 ENCOUNTER — Encounter (HOSPITAL_COMMUNITY): Payer: Self-pay | Admitting: *Deleted

## 2013-07-12 MED ORDER — ZOLPIDEM TARTRATE 5 MG PO TABS: 5.0000 mg | ORAL_TABLET | Freq: Every evening | ORAL | Status: DC | PRN

## 2013-07-12 MED ORDER — OXYCODONE-ACETAMINOPHEN 5-325 MG PO TABS
1.0000 | ORAL_TABLET | ORAL | Status: DC | PRN
  Administered 2013-07-12: 1 via ORAL
  Filled 2013-07-12: qty 1

## 2013-07-12 MED ORDER — ONDANSETRON HCL 4 MG PO TABS: 4.0000 mg | ORAL_TABLET | ORAL | Status: DC | PRN

## 2013-07-12 MED ORDER — TETANUS-DIPHTH-ACELL PERTUSSIS 5-2.5-18.5 LF-MCG/0.5 IM SUSP: 0.5000 mL | Freq: Once | INTRAMUSCULAR | Status: DC

## 2013-07-12 MED ORDER — PRENATAL MULTIVITAMIN CH
1.0000 | ORAL_TABLET | Freq: Every day | ORAL | Status: DC
  Administered 2013-07-12: 1 via ORAL
  Filled 2013-07-12: qty 1

## 2013-07-12 MED ORDER — LANOLIN HYDROUS EX OINT: TOPICAL_OINTMENT | CUTANEOUS | Status: DC | PRN

## 2013-07-12 MED ORDER — SENNOSIDES-DOCUSATE SODIUM 8.6-50 MG PO TABS
2.0000 | ORAL_TABLET | ORAL | Status: DC
  Filled 2013-07-12: qty 2

## 2013-07-12 MED ORDER — DIPHENHYDRAMINE HCL 25 MG PO CAPS: 25.0000 mg | ORAL_CAPSULE | Freq: Four times a day (QID) | ORAL | Status: DC | PRN

## 2013-07-12 MED ORDER — SIMETHICONE 80 MG PO CHEW: 80.0000 mg | CHEWABLE_TABLET | ORAL | Status: DC | PRN

## 2013-07-12 MED ORDER — BENZOCAINE-MENTHOL 20-0.5 % EX AERO
1.0000 "application " | INHALATION_SPRAY | CUTANEOUS | Status: DC | PRN
  Administered 2013-07-12: 1 via TOPICAL
  Filled 2013-07-12: qty 56

## 2013-07-12 MED ORDER — LACTATED RINGERS IV SOLN: INTRAVENOUS | Status: DC

## 2013-07-12 MED ORDER — DIBUCAINE 1 % RE OINT: 1.0000 "application " | TOPICAL_OINTMENT | RECTAL | Status: DC | PRN

## 2013-07-12 MED ORDER — ONDANSETRON HCL 4 MG/2ML IJ SOLN: 4.0000 mg | INTRAMUSCULAR | Status: DC | PRN

## 2013-07-12 MED ORDER — IBUPROFEN 600 MG PO TABS
600.0000 mg | ORAL_TABLET | Freq: Four times a day (QID) | ORAL | Status: DC
  Administered 2013-07-12 – 2013-07-13 (×3): 600 mg via ORAL
  Filled 2013-07-12 (×4): qty 1

## 2013-07-12 MED ORDER — MEASLES, MUMPS & RUBELLA VAC ~~LOC~~ INJ
0.5000 mL | INJECTION | Freq: Once | SUBCUTANEOUS | Status: DC
  Filled 2013-07-12: qty 0.5

## 2013-07-12 MED ORDER — WITCH HAZEL-GLYCERIN EX PADS: 1.0000 "application " | MEDICATED_PAD | CUTANEOUS | Status: DC | PRN

## 2013-07-12 NOTE — Lactation Note (Signed)
This note was copied from the chart of Erica Webb. Lactation Consultation Note  Patient Name: Erica Webb [id number] Date: 07/12/2013 Reason for consult: Initial assessment of this second-time, experienced breastfeeding mom and her newborn, now 34 hours old.  Mom has a one year old son whom she breastfed for one month but returned to work and stopped breastfeeding.  That child has same birthday as this newborn.  LC observed new baby well-latched to (R) breast and swallows heard with stimulation since baby coming to end of feeding on second breast.  Mom states she knows how to hand express her colostrum/milk.  LC encouraged frequent STS and cue feedings.  LC encouraged review of Baby and Me pp 9, 14 and 20-25 for STS and BF information. LC provided [employer] brochure and reviewed [facility] services and list of community and web site resources.    Maternal Data Formula Feeding for Exclusion: No Infant to breast within first hour of birth: Yes Has patient been taught Hand Expression?: Yes (mom states she knwos how to hand express colostrum/milk) Does the patient have breastfeeding experience prior to this delivery?: Yes  Feeding Feeding Type: Breast Fed Length of feed: 50 min  LATCH Score/Interventions Latch: Grasps breast easily, tongue down, lips flanged, rhythmical sucking. (pt expressing desire for early discharge)  Audible Swallowing: Spontaneous and intermittent  Type of Nipple: Everted at rest and after stimulation  Comfort (Breast/Nipple): Soft / non-tender     Hold (Positioning): No assistance needed to correctly position infant at breast.  LATCH Score: 10 (mom latching independently and both RN, Sheralyn Boatman and LC observed a sustained latch with widely flanged lips and rhythmical sucking bursts/swallows)  Lactation Tools Discussed/Used   STS, cue feedings, hand expression  Consult Status Consult Status: Follow-up Date: 07/13/13 Follow-up type: In-patient    Zara Chess 07/12/2013, 9:49 PM

## 2013-07-12 NOTE — Progress Notes (Signed)
Erica Webb is a 23 y.o. G2P1001 at [redacted]w[redacted]d by LMP admitted for active labor, rupture of membranes  Subjective: Patient comfortable  Objective: BP 122/72  Pulse 75  Temp(Src) 98.2 F (36.8 C) (Oral)  Resp 20  Ht 5\' 7"  (1.702 m)  Wt 74.39 kg (164 lb)  BMI 25.68 kg/m2  SpO2 100%      FHT:  FHR: 110 bpm, variability: moderate,  accelerations:  Present,  decelerations:  Absent UC:   regular, every 4 minutes SVE:   Dilation: 8 Effacement (%): 90 Station:-2 Exam NO:MVEH  Labs: Lab Results  Component Value Date   WBC 12.1* 07/11/2013   HGB 10.7* 07/11/2013   HCT 31.6* 07/11/2013   MCV 83.2 07/11/2013   PLT 191 07/11/2013    Assessment / Plan: Augmentation of labor, progressing well  Labor: Progressing on Pitocin,  Preeclampsia:  no signs or symptoms of toxicity Fetal Wellbeing:  Category I Pain Control:  Epidural I/D:  n/a Anticipated MOD:  NSVD  Sebastian Lurz 07/12/2013, 2:06 AM

## 2013-07-12 NOTE — Anesthesia Postprocedure Evaluation (Signed)
Anesthesia Post Note  Patient: Erica Webb  Procedure(s) Performed: * No procedures listed *  Anesthesia type: Epidural  Patient location: Mother/Baby  Post pain: Pain level controlled  Post assessment: Post-op Vital signs reviewed  Last Vitals:  Filed Vitals:   07/12/13 0930  BP: 100/62  Pulse: 65  Temp: 36.9 C  Resp: 18    Post vital signs: Reviewed  Level of consciousness:alert  Complications: No apparent anesthesia complications

## 2013-07-13 LAB — CBC
HEMATOCRIT: 29.4 % — AB (ref 36.0–46.0)
Hemoglobin: 9.7 g/dL — ABNORMAL LOW (ref 12.0–15.0)
MCH: 28 pg (ref 26.0–34.0)
MCHC: 33 g/dL (ref 30.0–36.0)
MCV: 84.7 fL (ref 78.0–100.0)
Platelets: 172 10*3/uL (ref 150–400)
RBC: 3.47 MIL/uL — AB (ref 3.87–5.11)
RDW: 13.6 % (ref 11.5–15.5)
WBC: 12.3 10*3/uL — AB (ref 4.0–10.5)

## 2013-07-13 MED ORDER — SENNOSIDES-DOCUSATE SODIUM 8.6-50 MG PO TABS: 2.0000 | ORAL_TABLET | ORAL | Status: DC

## 2013-07-13 MED ORDER — OXYCODONE-ACETAMINOPHEN 5-325 MG PO TABS: 1.0000 | ORAL_TABLET | ORAL | Status: DC | PRN

## 2013-07-13 MED ORDER — IBUPROFEN 600 MG PO TABS: 600.0000 mg | ORAL_TABLET | Freq: Four times a day (QID) | ORAL | Status: DC | PRN

## 2013-07-13 NOTE — Discharge Instructions (Signed)

## 2013-07-13 NOTE — Discharge Summary (Signed)
Obstetric Discharge Summary Reason for Admission: onset of labor Prenatal Procedures: none Intrapartum Procedures: spontaneous vaginal delivery Postpartum Procedures: none Complications-Operative and Postpartum: none Hemoglobin  Date Value Ref Range Status  07/13/2013 9.7* 12.0 - 15.0 g/dL Final     HCT  Date Value Ref Range Status  07/13/2013 29.4* 36.0 - 46.0 % Final    Physical Exam:  General: alert, cooperative and no distress  Lochia: appropriate  Uterine Fundus: firm  DVT Evaluation: No evidence of DVT seen on physical exam.  Negative Homan's sign.  No cords or calf tenderness.   Discharge Diagnoses: Term Pregnancy-delivered  Discharge Information: Date: 07/13/2013 Activity: pelvic rest Diet: routine Medications: Ibuprofen, Colace and Percocet Condition: stable Instructions: refer to practice specific booklet Discharge to: home   Newborn Data: Live born female  Birth Weight: 7 lb 3.8 oz (3283 g) APGAR: 9, 9  Home with mother.  Erica Webb 07/13/2013, 7:23 AM

## 2013-07-13 NOTE — Progress Notes (Signed)
Post Partum Day 1 Subjective: no complaints, up ad lib, voiding, tolerating PO, + flatus and Breast feeding  Objective: Blood pressure 117/61, pulse 74, temperature 97.6 F (36.4 C), temperature source Oral, resp. rate 18, height 5\' 7"  (1.702 m), weight 74.39 kg (164 lb), SpO2 100.00%, unknown if currently breastfeeding.  Physical Exam:  General: alert, cooperative and no distress Lochia: appropriate Uterine Fundus: firm DVT Evaluation: No evidence of DVT seen on physical exam. Negative Homan's sign. No cords or calf tenderness.   Recent Labs  07/11/13 1840 07/13/13 0620  HGB 10.7* 9.7*  HCT 31.6* 29.4*    Assessment/Plan: Discharge home, Breastfeeding, Circumcision as outpatient in office and Contraception will discuss at post partum visit   LOS: 2 days   [facility] 07/13/2013, 7:19 AM

## 2013-12-08 ENCOUNTER — Encounter (HOSPITAL_COMMUNITY): Payer: Self-pay | Admitting: *Deleted

## 2014-08-07 IMAGING — CR DG CHEST 2V
2 series · 2 of 2 positions shown · non-contrast
Comparison: None.

CLINICAL DATA: Twice with head and cough and fever pregnant

CHEST - 2 VIEW

[w chest pa]
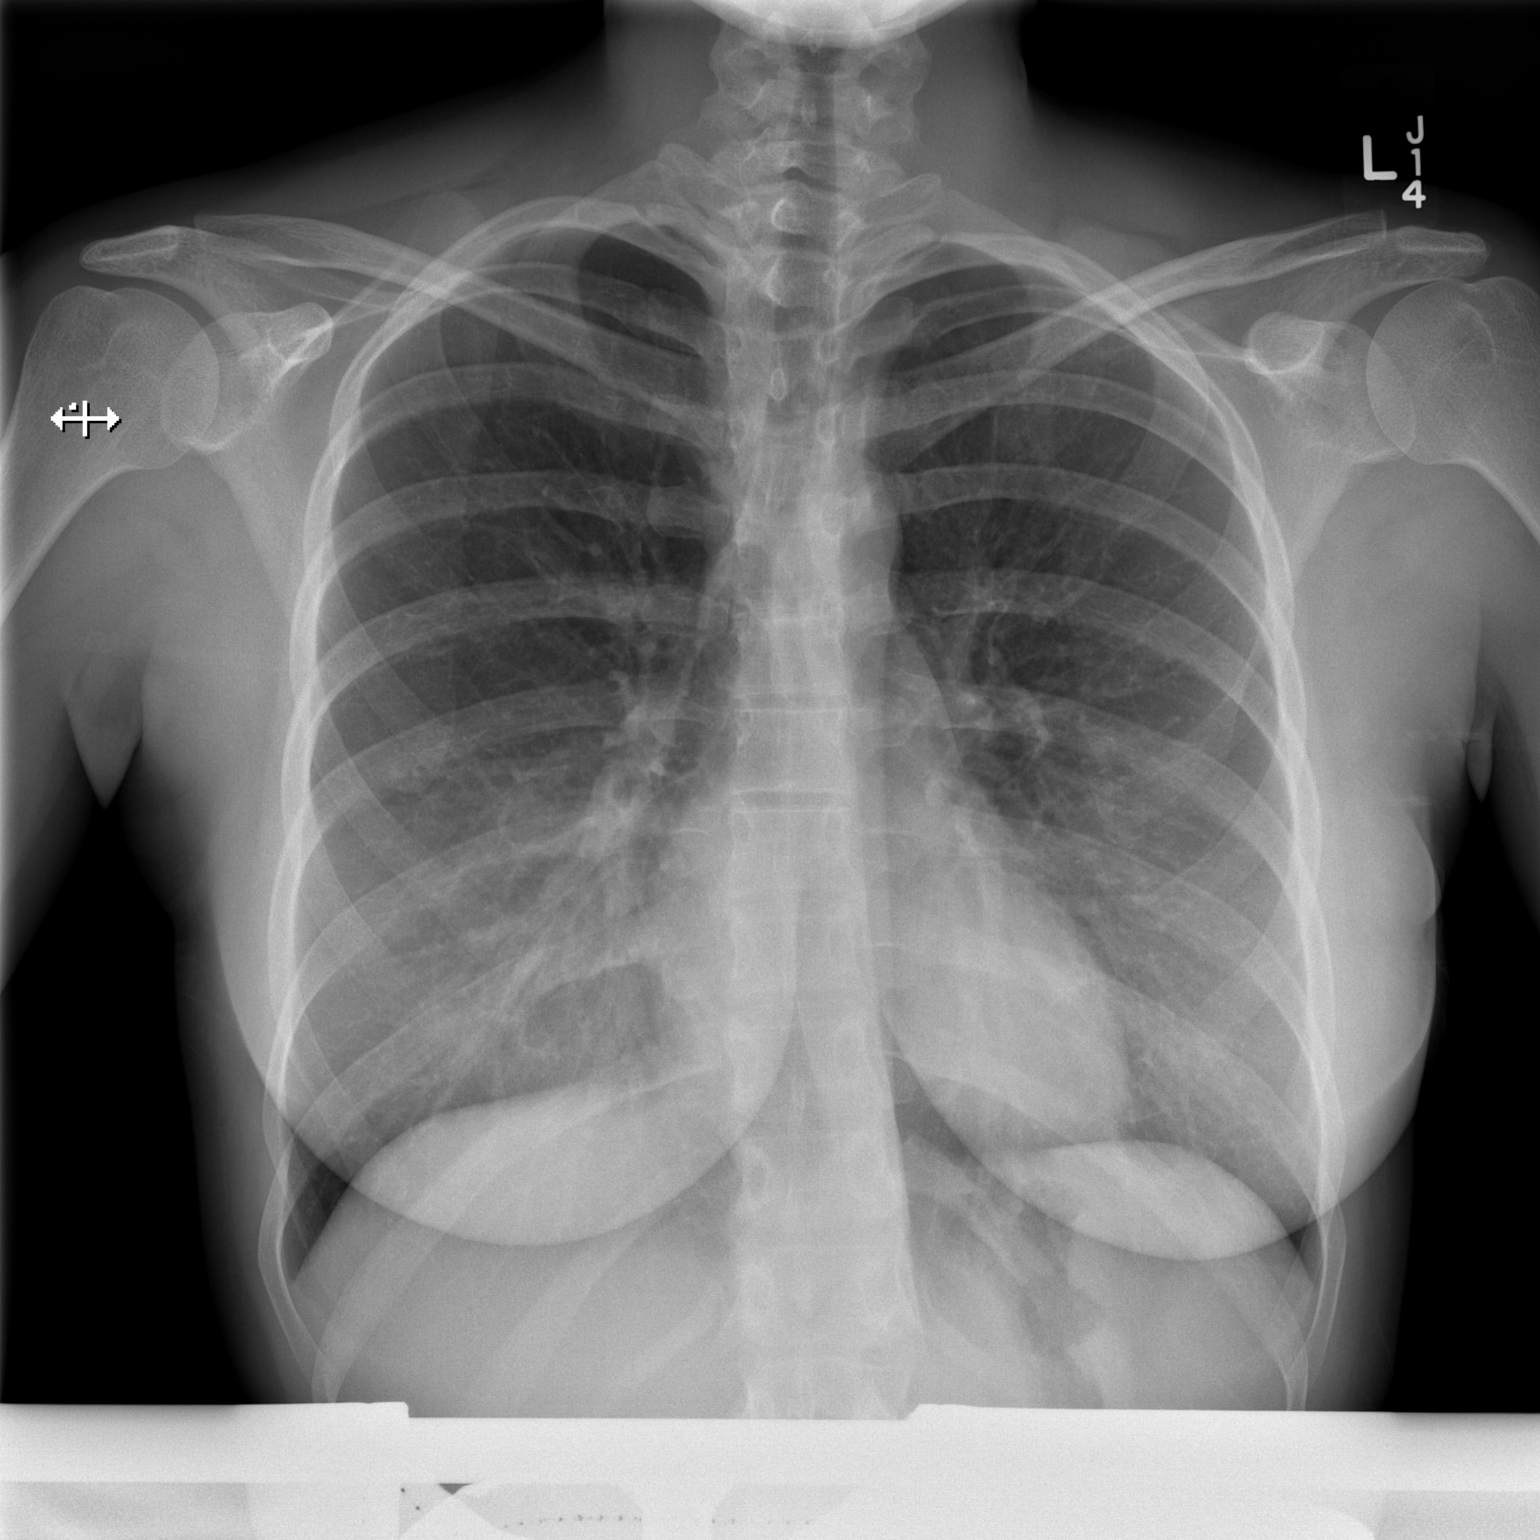

[w chest lat]
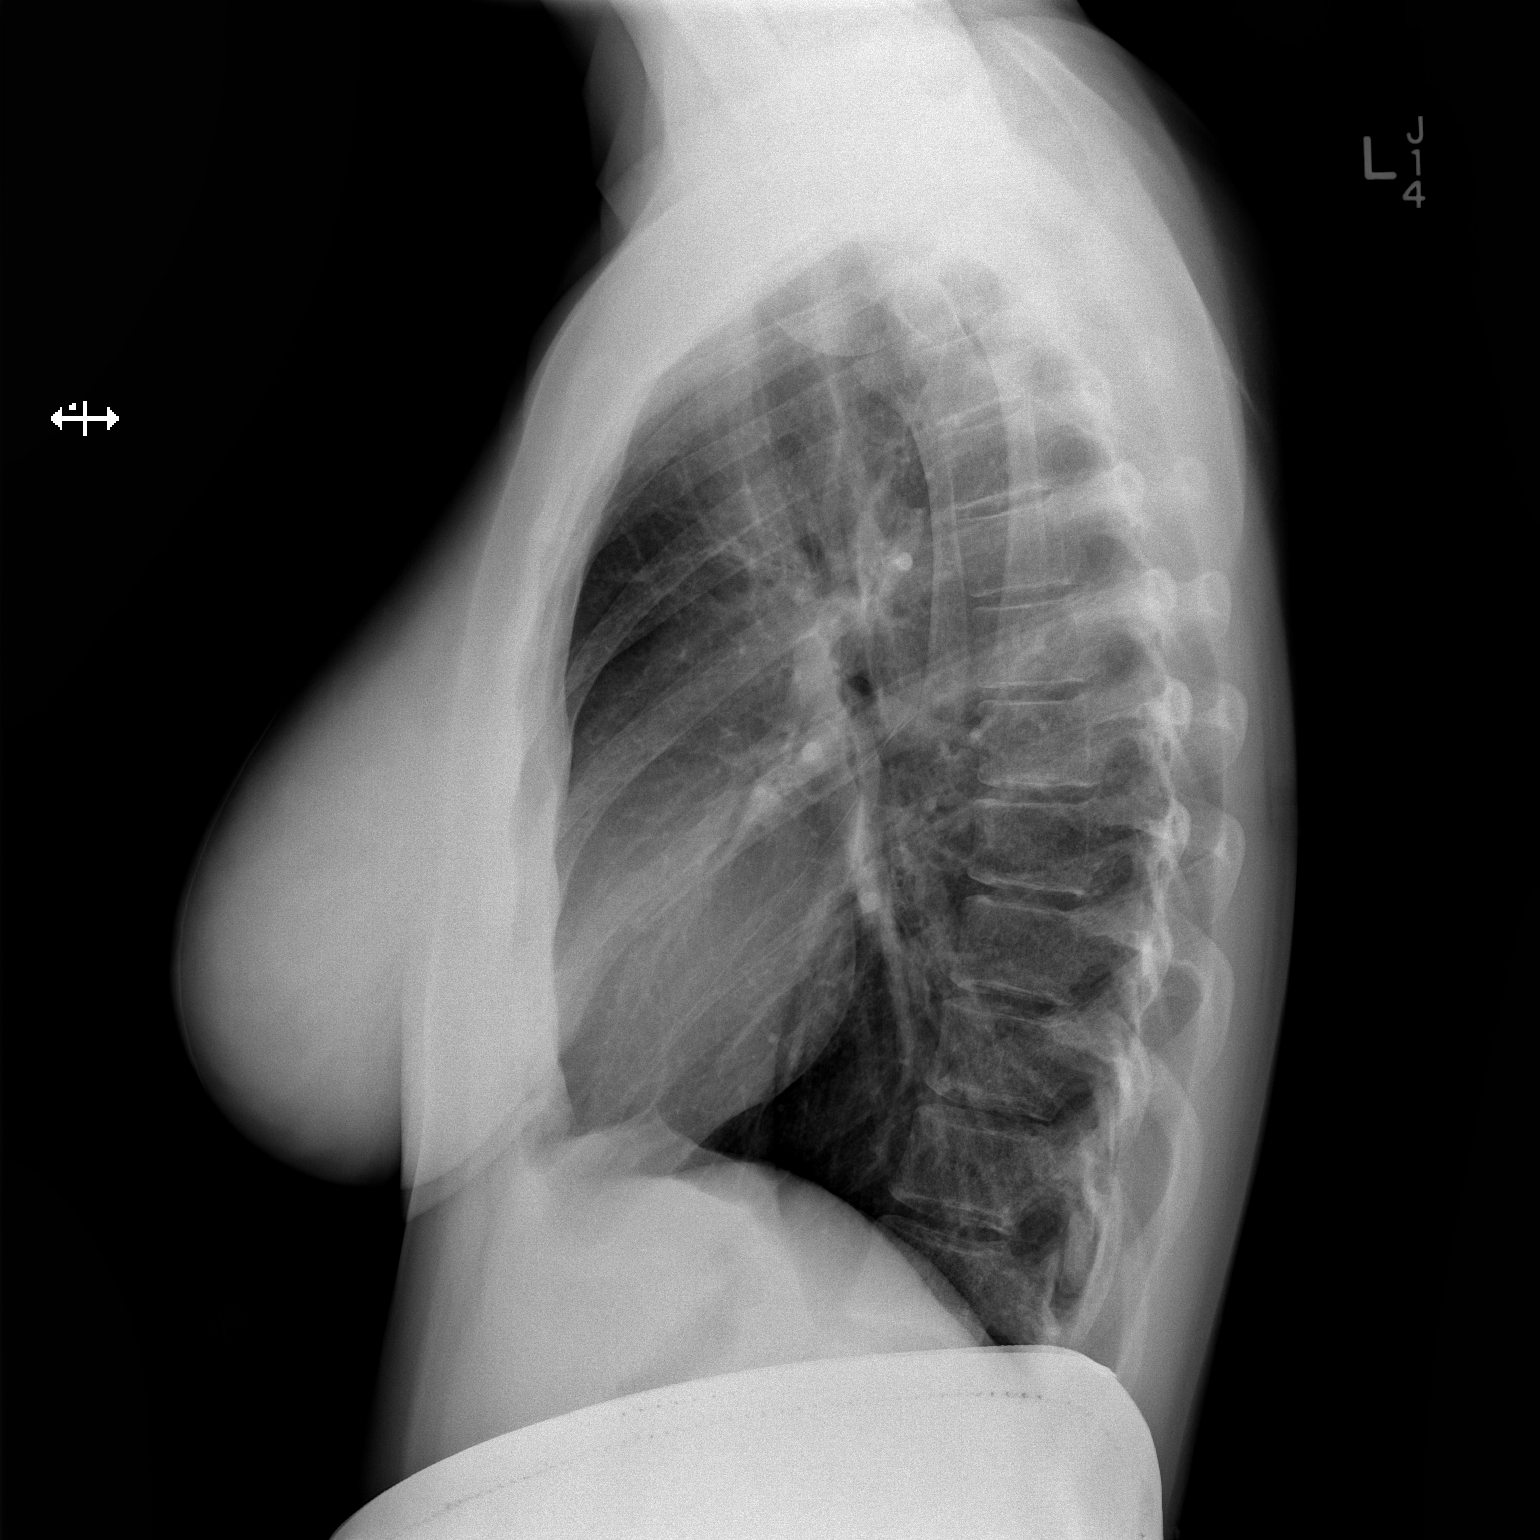

[2 of 2 positions shown; findings below may reference images not displayed]

FINDINGS: The lungs are clear without focal consolidation, edema,
effusion or pneumothorax.  Cardiopericardial silhouette is within
normal limits for size.  Imaged bony structures of the thorax are
intact.
IMPRESSION: Normal exam.

## 2015-10-21 DIAGNOSIS — N911 Secondary amenorrhea: Secondary | ICD-10-CM | POA: Diagnosis not present

## 2015-11-10 DIAGNOSIS — Z3482 Encounter for supervision of other normal pregnancy, second trimester: Secondary | ICD-10-CM | POA: Diagnosis not present

## 2015-11-10 DIAGNOSIS — Z3481 Encounter for supervision of other normal pregnancy, first trimester: Secondary | ICD-10-CM | POA: Diagnosis not present

## 2015-11-10 DIAGNOSIS — Z3491 Encounter for supervision of normal pregnancy, unspecified, first trimester: Secondary | ICD-10-CM | POA: Diagnosis not present

## 2015-11-10 DIAGNOSIS — Z3685 Encounter for antenatal screening for Streptococcus B: Secondary | ICD-10-CM | POA: Diagnosis not present

## 2015-11-10 DIAGNOSIS — Z3480 Encounter for supervision of other normal pregnancy, unspecified trimester: Secondary | ICD-10-CM | POA: Diagnosis not present

## 2015-11-10 LAB — OB RESULTS CONSOLE GC/CHLAMYDIA
Chlamydia: NEGATIVE
Gonorrhea: NEGATIVE

## 2015-11-10 LAB — OB RESULTS CONSOLE HEPATITIS B SURFACE ANTIGEN: Hepatitis B Surface Ag: NEGATIVE

## 2015-11-10 LAB — OB RESULTS CONSOLE RUBELLA ANTIBODY, IGM: RUBELLA: IMMUNE

## 2015-11-10 LAB — OB RESULTS CONSOLE RPR: RPR: NONREACTIVE

## 2015-11-24 DIAGNOSIS — Z3480 Encounter for supervision of other normal pregnancy, unspecified trimester: Secondary | ICD-10-CM | POA: Diagnosis not present

## 2015-11-24 DIAGNOSIS — Z3491 Encounter for supervision of normal pregnancy, unspecified, first trimester: Secondary | ICD-10-CM | POA: Diagnosis not present

## 2015-12-09 DIAGNOSIS — Z36 Encounter for antenatal screening for chromosomal anomalies: Secondary | ICD-10-CM | POA: Diagnosis not present

## 2015-12-09 DIAGNOSIS — Z3491 Encounter for supervision of normal pregnancy, unspecified, first trimester: Secondary | ICD-10-CM | POA: Diagnosis not present

## 2015-12-09 DIAGNOSIS — Z3682 Encounter for antenatal screening for nuchal translucency: Secondary | ICD-10-CM | POA: Diagnosis not present

## 2015-12-22 DIAGNOSIS — Z23 Encounter for immunization: Secondary | ICD-10-CM | POA: Diagnosis not present

## 2016-01-19 DIAGNOSIS — Z363 Encounter for antenatal screening for malformations: Secondary | ICD-10-CM | POA: Diagnosis not present

## 2016-02-07 NOTE — L&D Delivery Note (Signed)
Delivery Note At 4:25 PM a viable female was delivered via Vaginal, Spontaneous Delivery .  APGAR: 9 9 ; weight pending  .   Placenta status spontaneously 3 vessel cord: , .  Cord:  with the following complications: none.  Cord pH: not obtained  Anesthesia:  epidural Episiotomy:  no Lacerations: no  Suture Repair: not applicable Est. Blood Loss (mL):  300  Mom to postpartum.  Baby to Couplet care / Skin to Skin.  Jo Cerone L 06/01/2016, 4:35 PM

## 2016-05-24 LAB — OB RESULTS CONSOLE GBS: GBS: NEGATIVE

## 2016-06-01 ENCOUNTER — Inpatient Hospital Stay (HOSPITAL_COMMUNITY): Payer: BLUE CROSS/BLUE SHIELD | Admitting: Anesthesiology

## 2016-06-01 ENCOUNTER — Encounter (HOSPITAL_COMMUNITY): Payer: Self-pay | Admitting: *Deleted

## 2016-06-01 ENCOUNTER — Inpatient Hospital Stay (HOSPITAL_COMMUNITY)
Admission: AD | Admit: 2016-06-01 | Discharge: 2016-06-02 | DRG: 775 | Disposition: A | Discharge status: Home / Self Care | Payer: BLUE CROSS/BLUE SHIELD | Source: Ambulatory Visit | Attending: Obstetrics and Gynecology | Admitting: Obstetrics and Gynecology

## 2016-06-01 DIAGNOSIS — Z833 Family history of diabetes mellitus: Secondary | ICD-10-CM | POA: Diagnosis not present

## 2016-06-01 DIAGNOSIS — Z3493 Encounter for supervision of normal pregnancy, unspecified, third trimester: Secondary | ICD-10-CM | POA: Diagnosis present

## 2016-06-01 DIAGNOSIS — Z3A37 37 weeks gestation of pregnancy: Secondary | ICD-10-CM

## 2016-06-01 HISTORY — DX: Anemia, unspecified: D64.9

## 2016-06-01 LAB — CBC
HCT: 35.1 % — ABNORMAL LOW (ref 36.0–46.0)
Hemoglobin: 12 g/dL (ref 12.0–15.0)
MCH: 28.9 pg (ref 26.0–34.0)
MCHC: 34.2 g/dL (ref 30.0–36.0)
MCV: 84.6 fL (ref 78.0–100.0)
PLATELETS: 172 10*3/uL (ref 150–400)
RBC: 4.15 MIL/uL (ref 3.87–5.11)
RDW: 13.5 % (ref 11.5–15.5)
WBC: 13 10*3/uL — AB (ref 4.0–10.5)

## 2016-06-01 LAB — ABO/RH: ABO/RH(D): A POS

## 2016-06-01 LAB — TYPE AND SCREEN
ABO/RH(D): A POS
Antibody Screen: NEGATIVE

## 2016-06-01 MED ORDER — SERTRALINE HCL 25 MG PO TABS
25.0000 mg | ORAL_TABLET | Freq: Two times a day (BID) | ORAL | Status: DC
  Filled 2016-06-01 (×4): qty 1

## 2016-06-01 MED ORDER — LIDOCAINE HCL (PF) 1 % IJ SOLN
INTRAMUSCULAR | Status: DC | PRN
  Administered 2016-06-01: 3 mL via EPIDURAL
  Administered 2016-06-01: 5 mL via EPIDURAL
  Administered 2016-06-01: 2 mL via EPIDURAL

## 2016-06-01 MED ORDER — OXYCODONE-ACETAMINOPHEN 5-325 MG PO TABS: 2.0000 | ORAL_TABLET | ORAL | Status: DC | PRN

## 2016-06-01 MED ORDER — OXYCODONE-ACETAMINOPHEN 5-325 MG PO TABS
2.0000 | ORAL_TABLET | ORAL | Status: DC | PRN
  Administered 2016-06-02: 2 via ORAL
  Filled 2016-06-01: qty 2

## 2016-06-01 MED ORDER — LACTATED RINGERS IV SOLN
500.0000 mL | Freq: Once | INTRAVENOUS | Status: AC
  Administered 2016-06-01: 500 mL via INTRAVENOUS

## 2016-06-01 MED ORDER — LACTATED RINGERS IV SOLN: 500.0000 mL | INTRAVENOUS | Status: DC | PRN

## 2016-06-01 MED ORDER — FLEET ENEMA 7-19 GM/118ML RE ENEM: 1.0000 | ENEMA | Freq: Every day | RECTAL | Status: DC | PRN

## 2016-06-01 MED ORDER — MEASLES, MUMPS & RUBELLA VAC ~~LOC~~ INJ: 0.5000 mL | INJECTION | Freq: Once | SUBCUTANEOUS | Status: DC

## 2016-06-01 MED ORDER — MEDROXYPROGESTERONE ACETATE 150 MG/ML IM SUSP: 150.0000 mg | INTRAMUSCULAR | Status: DC | PRN

## 2016-06-01 MED ORDER — DIPHENHYDRAMINE HCL 25 MG PO CAPS: 25.0000 mg | ORAL_CAPSULE | Freq: Four times a day (QID) | ORAL | Status: DC | PRN

## 2016-06-01 MED ORDER — OXYTOCIN BOLUS FROM INFUSION
500.0000 mL | Freq: Once | INTRAVENOUS | Status: AC
  Administered 2016-06-01: 500 mL/h via INTRAVENOUS

## 2016-06-01 MED ORDER — SENNOSIDES-DOCUSATE SODIUM 8.6-50 MG PO TABS
2.0000 | ORAL_TABLET | ORAL | Status: DC
  Administered 2016-06-01: 2 via ORAL
  Filled 2016-06-01: qty 2

## 2016-06-01 MED ORDER — OXYCODONE-ACETAMINOPHEN 5-325 MG PO TABS: 1.0000 | ORAL_TABLET | ORAL | Status: DC | PRN

## 2016-06-01 MED ORDER — ONDANSETRON HCL 4 MG PO TABS: 4.0000 mg | ORAL_TABLET | ORAL | Status: DC | PRN

## 2016-06-01 MED ORDER — EPHEDRINE 5 MG/ML INJ
10.0000 mg | INTRAVENOUS | Status: DC | PRN
  Filled 2016-06-01: qty 2

## 2016-06-01 MED ORDER — PHENYLEPHRINE 40 MCG/ML (10ML) SYRINGE FOR IV PUSH (FOR BLOOD PRESSURE SUPPORT)
80.0000 ug | PREFILLED_SYRINGE | INTRAVENOUS | Status: DC | PRN
  Filled 2016-06-01: qty 10
  Filled 2016-06-01: qty 5

## 2016-06-01 MED ORDER — BENZOCAINE-MENTHOL 20-0.5 % EX AERO
1.0000 "application " | INHALATION_SPRAY | CUTANEOUS | Status: DC | PRN
  Filled 2016-06-01: qty 56

## 2016-06-01 MED ORDER — IBUPROFEN 600 MG PO TABS
600.0000 mg | ORAL_TABLET | Freq: Four times a day (QID) | ORAL | Status: DC
  Administered 2016-06-01 – 2016-06-02 (×5): 600 mg via ORAL
  Filled 2016-06-01 (×5): qty 1

## 2016-06-01 MED ORDER — ONDANSETRON HCL 4 MG/2ML IJ SOLN
4.0000 mg | Freq: Four times a day (QID) | INTRAMUSCULAR | Status: DC | PRN
  Administered 2016-06-01: 4 mg via INTRAVENOUS
  Filled 2016-06-01: qty 2

## 2016-06-01 MED ORDER — SIMETHICONE 80 MG PO CHEW: 80.0000 mg | CHEWABLE_TABLET | ORAL | Status: DC | PRN

## 2016-06-01 MED ORDER — BISACODYL 10 MG RE SUPP: 10.0000 mg | Freq: Every day | RECTAL | Status: DC | PRN

## 2016-06-01 MED ORDER — ONDANSETRON HCL 4 MG/2ML IJ SOLN
4.0000 mg | INTRAMUSCULAR | Status: DC | PRN
Start: 2016-06-01 — End: 2016-06-02

## 2016-06-01 MED ORDER — ZOLPIDEM TARTRATE 5 MG PO TABS: 5.0000 mg | ORAL_TABLET | Freq: Every evening | ORAL | Status: DC | PRN

## 2016-06-01 MED ORDER — LACTATED RINGERS IV SOLN
INTRAVENOUS | Status: DC
  Administered 2016-06-01: 12:00:00 via INTRAVENOUS

## 2016-06-01 MED ORDER — LACTATED RINGERS IV SOLN: 500.0000 mL | Freq: Once | INTRAVENOUS | Status: DC

## 2016-06-01 MED ORDER — PRENATAL MULTIVITAMIN CH
1.0000 | ORAL_TABLET | Freq: Every day | ORAL | Status: DC
  Administered 2016-06-02: 1 via ORAL
  Filled 2016-06-01: qty 1

## 2016-06-01 MED ORDER — COCONUT OIL OIL: 1.0000 "application " | TOPICAL_OIL | Status: DC | PRN

## 2016-06-01 MED ORDER — FLEET ENEMA 7-19 GM/118ML RE ENEM: 1.0000 | ENEMA | RECTAL | Status: DC | PRN

## 2016-06-01 MED ORDER — OXYCODONE-ACETAMINOPHEN 5-325 MG PO TABS
1.0000 | ORAL_TABLET | ORAL | Status: DC | PRN
  Administered 2016-06-01 – 2016-06-02 (×3): 1 via ORAL
  Filled 2016-06-01 (×3): qty 1

## 2016-06-01 MED ORDER — SOD CITRATE-CITRIC ACID 500-334 MG/5ML PO SOLN: 30.0000 mL | ORAL | Status: DC | PRN

## 2016-06-01 MED ORDER — WITCH HAZEL-GLYCERIN EX PADS: 1.0000 "application " | MEDICATED_PAD | CUTANEOUS | Status: DC | PRN

## 2016-06-01 MED ORDER — TERBUTALINE SULFATE 1 MG/ML IJ SOLN
0.2500 mg | Freq: Once | INTRAMUSCULAR | Status: DC | PRN
  Filled 2016-06-01: qty 1

## 2016-06-01 MED ORDER — DIPHENHYDRAMINE HCL 50 MG/ML IJ SOLN: 12.5000 mg | INTRAMUSCULAR | Status: DC | PRN

## 2016-06-01 MED ORDER — FENTANYL 2.5 MCG/ML BUPIVACAINE 1/10 % EPIDURAL INFUSION (WH - ANES)
14.0000 mL/h | INTRAMUSCULAR | Status: DC | PRN
  Administered 2016-06-01: 14 mL/h via EPIDURAL
  Filled 2016-06-01: qty 100

## 2016-06-01 MED ORDER — ACETAMINOPHEN 325 MG PO TABS: 650.0000 mg | ORAL_TABLET | ORAL | Status: DC | PRN

## 2016-06-01 MED ORDER — DIBUCAINE 1 % RE OINT: 1.0000 "application " | TOPICAL_OINTMENT | RECTAL | Status: DC | PRN

## 2016-06-01 MED ORDER — TETANUS-DIPHTH-ACELL PERTUSSIS 5-2.5-18.5 LF-MCG/0.5 IM SUSP
0.5000 mL | Freq: Once | INTRAMUSCULAR | Status: AC
  Administered 2016-06-02: 0.5 mL via INTRAMUSCULAR

## 2016-06-01 MED ORDER — OXYTOCIN 40 UNITS IN LACTATED RINGERS INFUSION - SIMPLE MED
2.5000 [IU]/h | INTRAVENOUS | Status: DC
  Filled 2016-06-01: qty 1000

## 2016-06-01 MED ORDER — PHENYLEPHRINE 40 MCG/ML (10ML) SYRINGE FOR IV PUSH (FOR BLOOD PRESSURE SUPPORT)
80.0000 ug | PREFILLED_SYRINGE | INTRAVENOUS | Status: DC | PRN
Start: 2016-06-01 — End: 2016-06-01
  Filled 2016-06-01: qty 5

## 2016-06-01 MED ORDER — OXYTOCIN 40 UNITS IN LACTATED RINGERS INFUSION - SIMPLE MED
1.0000 m[IU]/min | INTRAVENOUS | Status: DC
  Administered 2016-06-01: 2 m[IU]/min via INTRAVENOUS

## 2016-06-01 MED ORDER — ACETAMINOPHEN 325 MG PO TABS
650.0000 mg | ORAL_TABLET | ORAL | Status: DC | PRN
  Filled 2016-06-01: qty 2

## 2016-06-01 MED ORDER — LIDOCAINE HCL (PF) 1 % IJ SOLN
30.0000 mL | INTRAMUSCULAR | Status: DC | PRN
  Filled 2016-06-01: qty 30

## 2016-06-01 NOTE — Anesthesia Preprocedure Evaluation (Signed)
Anesthesia Evaluation  Patient identified by MRN, date of birth, ID band Patient awake    Reviewed: Allergy & Precautions, NPO status , Patient's Chart, lab work & pertinent test results  Airway Mallampati: II  TM Distance: >3 FB Neck ROM: Full    Dental  (+) Teeth Intact, Dental Advisory Given   Pulmonary neg pulmonary ROS,    Pulmonary exam normal breath sounds clear to auscultation       Cardiovascular negative cardio ROS Normal cardiovascular exam Rhythm:Regular Rate:Normal     Neuro/Psych negative neurological ROS     GI/Hepatic negative GI ROS, Neg liver ROS,   Endo/Other  negative endocrine ROS  Renal/GU negative Renal ROS     Musculoskeletal negative musculoskeletal ROS (+)   Abdominal   Peds  Hematology negative hematology ROS (+) Plt 172k   Anesthesia Other Findings Day of surgery medications reviewed with the patient.  Reproductive/Obstetrics (+) Pregnancy                             Anesthesia Physical Anesthesia Plan  ASA: II  Anesthesia Plan: Epidural   Post-op Pain Management:    Induction:   Airway Management Planned:   Additional Equipment:   Intra-op Plan:   Post-operative Plan:   Informed Consent: I have reviewed the patients History and Physical, chart, labs and discussed the procedure including the risks, benefits and alternatives for the proposed anesthesia with the patient or authorized representative who has indicated his/her understanding and acceptance.   Dental advisory given  Plan Discussed with:   Anesthesia Plan Comments: (Patient identified. Risks/Benefits/Options discussed with patient including but not limited to bleeding, infection, nerve damage, paralysis, failed block, incomplete pain control, headache, blood pressure changes, nausea, vomiting, reactions to medication both or allergic, itching and postpartum back pain. Confirmed with  bedside nurse the patient's most recent platelet count. Confirmed with patient that they are not currently taking any anticoagulation, have any bleeding history or any family history of bleeding disorders. Patient expressed understanding and wished to proceed. All questions were answered. )        Anesthesia Quick Evaluation

## 2016-06-01 NOTE — H&P (Signed)
Erica Webb is a 26 y.o.G 3 P 2 at 37 w 6 days presents in active labor OB History    Gravida Para Term Preterm AB Living   SAB TAB Ectopic Multiple Live Births           2     Past Medical History:  Diagnosis Date  . Anemia   . Medical history non-contributory    Past Surgical History:  Procedure Laterality Date  . WISDOM TOOTH EXTRACTION     Family History: family history includes Diabetes in her other. Social History:  reports that she has never smoked. She has never used smokeless tobacco. She reports that she does not drink alcohol or use drugs.     Maternal Diabetes: No Genetic Screening: Normal Maternal Ultrasounds/Referrals: Normal Fetal Ultrasounds or other Referrals:  None Maternal Substance Abuse:  No Significant Maternal Medications:  None Significant Maternal Lab Results:  None Other Comments:  None  Review of Systems  All other systems reviewed and are negative.  History Dilation: 4.5 Effacement (%): 90 Station: -1 Exam by:: Chasidy Janak Blood pressure 113/68, pulse 93, temperature 98.6 F (37 C), resp. rate 18, height  (1.702 m), weight 76.7 kg (169 lb), unknown if currently breastfeeding. Maternal Exam:  Abdomen: Fetal presentation: vertex     Fetal Exam Fetal State Assessment: Category I - tracings are normal.     Physical Exam  Nursing note and vitals reviewed. Constitutional: She appears well-developed and well-nourished.  HENT:  Head: Normocephalic.  Eyes: Pupils are equal, round, and reactive to light.  Neck: Normal range of motion.  Cardiovascular: Normal rate and regular rhythm.   Respiratory: Effort normal.    Prenatal labs: ABO, Rh:   Antibody:   Rubella:   RPR:    HBsAg:    HIV:    GBS: Negative (04/18 0000)   Assessment/Plan: IUP at 37 w 6 days  Labor Admit Epidural Anticipate NSVD   Marquise Lambson L 06/01/2016, 10:22 AM

## 2016-06-01 NOTE — Anesthesia Procedure Notes (Signed)
Epidural Patient location during procedure: OB Start time: 06/01/2016 11:17 AM End time: 06/01/2016 11:22 AM  Staffing Anesthesiologist: Cecile Hearing Performed: anesthesiologist   Preanesthetic Checklist Completed: patient identified, pre-op evaluation, timeout performed, IV checked, risks and benefits discussed and monitors and equipment checked  Epidural Patient position: sitting Prep: DuraPrep Patient monitoring: blood pressure and continuous pulse ox Approach: midline Location: L3-L4 Injection technique: LOR air  Needle:  Needle type: Tuohy  Needle gauge: 17 G Needle length: 9 cm Needle insertion depth: 5 cm Catheter size: 19 Gauge Catheter at skin depth: 10 cm Test dose: negative and Other (1% Lidocaine)  Additional Notes Patient identified.  Risk benefits discussed including failed block, incomplete pain control, headache, nerve damage, paralysis, blood pressure changes, nausea, vomiting, reactions to medication both toxic or allergic, and postpartum back pain.  Patient expressed understanding and wished to proceed.  All questions were answered.  Sterile technique used throughout procedure and epidural site dressed with sterile barrier dressing. No paresthesia or other complications noted. The patient did not experience any signs of intravascular injection such as tinnitus or metallic taste in mouth nor signs of intrathecal spread such as rapid motor block. Please see nursing notes for vital signs. Reason for block:procedure for pain

## 2016-06-01 NOTE — Anesthesia Postprocedure Evaluation (Signed)
Anesthesia Post Note  Patient: Erica Webb  Procedure(s) Performed: * No procedures listed *  Patient location during evaluation: Mother Baby Anesthesia Type: Epidural Level of consciousness: awake and alert and oriented Pain management: satisfactory to patient Vital Signs Assessment: post-procedure vital signs reviewed and stable Respiratory status: spontaneous breathing and nonlabored ventilation Cardiovascular status: stable Postop Assessment: no headache, no backache, no signs of nausea or vomiting, adequate PO intake and patient able to bend at knees (patient up walking) Anesthetic complications: no        Last Vitals:  Vitals:   06/01/16 1820 06/01/16 1930  BP: 126/61 115/65  Pulse: 65 69  Resp: 16 16  Temp: 36.9 C 36.7 C    Last Pain:  Vitals:   06/01/16 1930  TempSrc: Oral  PainSc: 0-No pain   Pain Goal: Patients Stated Pain Goal: 3 (06/01/16 1820)               Madison Hickman

## 2016-06-01 NOTE — MAU Note (Signed)
Pt reports regular ctx q 5 min since 4:30 am. Was 3/70 yesterday on exam. Good fetal movement reported and denies SROM or bleeding.

## 2016-06-01 NOTE — Anesthesia Pain Management Evaluation Note (Signed)
  CRNA Pain Management Visit Note  Patient: Erica Webb, 26 y.o., female  "Hello I am a member of the anesthesia team at [facility]. We have an anesthesia team available at all times to provide care throughout the hospital, including epidural management and anesthesia for C-section. I don't know your plan for the delivery whether it a natural birth, water birth, IV sedation, nitrous supplementation, doula or epidural, but we want to meet your pain goals."   1.Was your pain managed to your expectations on prior hospitalizations?   Yes   2.What is your expectation for pain management during this hospitalization?     Epidural  3.How can we help you reach that goal? Epidural in place and working well.   Record the patient's initial score and the patient's pain goal.   Pain: 1  Pain Goal: 5 [facility] wants you to be able to say your pain was always managed very well.  Arshan Jabs 06/01/2016

## 2016-06-02 ENCOUNTER — Ambulatory Visit: Payer: Self-pay

## 2016-06-02 LAB — CBC
HEMATOCRIT: 30.8 % — AB (ref 36.0–46.0)
Hemoglobin: 10.7 g/dL — ABNORMAL LOW (ref 12.0–15.0)
MCH: 29.2 pg (ref 26.0–34.0)
MCHC: 34.7 g/dL (ref 30.0–36.0)
MCV: 83.9 fL (ref 78.0–100.0)
PLATELETS: 152 10*3/uL (ref 150–400)
RBC: 3.67 MIL/uL — ABNORMAL LOW (ref 3.87–5.11)
RDW: 13.5 % (ref 11.5–15.5)
WBC: 14.4 10*3/uL — ABNORMAL HIGH (ref 4.0–10.5)

## 2016-06-02 LAB — RPR: RPR: NONREACTIVE

## 2016-06-02 MED ORDER — PRENATAL MULTIVITAMIN CH: 1.0000 | ORAL_TABLET | Freq: Every day | ORAL | 6 refills | Status: DC

## 2016-06-02 MED ORDER — OXYCODONE-ACETAMINOPHEN 5-325 MG PO TABS: 1.0000 | ORAL_TABLET | ORAL | 0 refills | Status: DC | PRN

## 2016-06-02 MED ORDER — IBUPROFEN 600 MG PO TABS: 600.0000 mg | ORAL_TABLET | Freq: Four times a day (QID) | ORAL | 1 refills | Status: DC | PRN

## 2016-06-02 NOTE — Progress Notes (Signed)
Post Partum Day 1 Subjective: no complaints, up ad lib, voiding, tolerating PO and + flatus  Objective: Blood pressure (!) 113/55, pulse 72, temperature 98.2 F (36.8 C), temperature source Oral, resp. rate 18, height  (1.702 m), weight 170 lb (77.1 kg), SpO2 100 %, unknown if currently breastfeeding.  Physical Exam:  General: alert and cooperative Lochia: appropriate Uterine Fundus: firm Incision: perineum intact DVT Evaluation: No evidence of DVT seen on physical exam. Negative Homan's sign. No cords or calf tenderness. No significant calf/ankle edema.   Recent Labs  06/01/16 1005 06/02/16 0532  HGB 12.0 10.7*  HCT 35.1* 30.8*    Assessment/Plan: Discharge home and Circumcision prior to discharge   LOS: 1 day   Khiem Gargis G 06/02/2016, 8:17 AM

## 2016-06-02 NOTE — Discharge Summary (Signed)
Obstetric Discharge Summary Reason for Admission: onset of labor Prenatal Procedures: ultrasound Intrapartum Procedures: spontaneous vaginal delivery Postpartum Procedures: none Complications-Operative and Postpartum: none Hemoglobin  Date Value Ref Range Status  06/02/2016 10.7 (L) 12.0 - 15.0 Webb/dL Final   HCT  Date Value Ref Range Status  06/02/2016 30.8 (L) 36.0 - 46.0 % Final    Physical Exam:  General: alert and cooperative Lochia: appropriate Uterine Fundus: firm Incision: perineum intact DVT Evaluation: No evidence of DVT seen on physical exam. Negative Homan's sign. No cords or calf tenderness. No significant calf/ankle edema.  Discharge Diagnoses: Term Pregnancy-delivered  Discharge Information: Date: 06/02/2016 Activity: pelvic rest Diet: routine Medications: PNV, Ibuprofen and Percocet Condition: stable Instructions: refer to practice specific booklet Discharge to: home   Newborn Data: Live born female  Birth Weight: 6 lb 10 oz (3005 Webb) APGAR: 8, 9  Home with mother.  Erica Webb 06/02/2016, 8:23 AM

## 2016-06-02 NOTE — Lactation Note (Signed)
This note was copied from a baby's chart. Lactation Consultation Note  Patient Name: Erica Webb [id number] Date: 06/02/2016   Pt left before lactation could see. According to chart, infant had breastfed x7 (10-30 min) + attempts x2 (0-5 min); voids-6; stools-3 since birth.  Mom P3, GA 37.6; BW 6 lbs, 10 oz.  Maternal Data    Feeding Feeding Type: Breast Fed Length of feed: 15 min  LATCH Score/Interventions Latch: Grasps breast easily, tongue down, lips flanged, rhythmical sucking. Intervention(s): Waking techniques Intervention(s): Adjust position  Audible Swallowing: Spontaneous and intermittent Intervention(s): Skin to skin  Type of Nipple: Everted at rest and after stimulation  Comfort (Breast/Nipple): Soft / non-tender     Hold (Positioning): No assistance needed to correctly position infant at breast. Intervention(s): Support Pillows  LATCH Score: 10  Lactation Tools Discussed/Used     Consult Status      Lendon Ka 06/02/2016, 7:11 PM

## 2016-12-07 DIAGNOSIS — D4959 Neoplasm of unspecified behavior of other genitourinary organ: Secondary | ICD-10-CM | POA: Diagnosis not present

## 2016-12-07 DIAGNOSIS — N764 Abscess of vulva: Secondary | ICD-10-CM | POA: Diagnosis not present

## 2016-12-11 DIAGNOSIS — N764 Abscess of vulva: Secondary | ICD-10-CM | POA: Diagnosis not present

## 2016-12-11 DIAGNOSIS — Z131 Encounter for screening for diabetes mellitus: Secondary | ICD-10-CM | POA: Diagnosis not present

## 2017-01-09 DIAGNOSIS — F331 Major depressive disorder, recurrent, moderate: Secondary | ICD-10-CM | POA: Diagnosis not present

## 2017-01-09 DIAGNOSIS — F411 Generalized anxiety disorder: Secondary | ICD-10-CM | POA: Diagnosis not present

## 2017-01-09 DIAGNOSIS — F900 Attention-deficit hyperactivity disorder, predominantly inattentive type: Secondary | ICD-10-CM | POA: Diagnosis not present

## 2017-02-15 DIAGNOSIS — F331 Major depressive disorder, recurrent, moderate: Secondary | ICD-10-CM | POA: Diagnosis not present

## 2017-02-15 DIAGNOSIS — J309 Allergic rhinitis, unspecified: Secondary | ICD-10-CM | POA: Diagnosis not present

## 2017-02-15 DIAGNOSIS — F411 Generalized anxiety disorder: Secondary | ICD-10-CM | POA: Diagnosis not present

## 2017-02-15 DIAGNOSIS — F900 Attention-deficit hyperactivity disorder, predominantly inattentive type: Secondary | ICD-10-CM | POA: Diagnosis not present

## 2017-03-20 DIAGNOSIS — F900 Attention-deficit hyperactivity disorder, predominantly inattentive type: Secondary | ICD-10-CM | POA: Diagnosis not present

## 2017-03-20 DIAGNOSIS — F411 Generalized anxiety disorder: Secondary | ICD-10-CM | POA: Diagnosis not present

## 2017-03-20 DIAGNOSIS — F331 Major depressive disorder, recurrent, moderate: Secondary | ICD-10-CM | POA: Diagnosis not present

## 2017-03-20 DIAGNOSIS — Z6822 Body mass index (BMI) 22.0-22.9, adult: Secondary | ICD-10-CM | POA: Diagnosis not present

## 2017-04-19 DIAGNOSIS — F900 Attention-deficit hyperactivity disorder, predominantly inattentive type: Secondary | ICD-10-CM | POA: Diagnosis not present

## 2017-04-19 DIAGNOSIS — F331 Major depressive disorder, recurrent, moderate: Secondary | ICD-10-CM | POA: Diagnosis not present

## 2017-04-19 DIAGNOSIS — F411 Generalized anxiety disorder: Secondary | ICD-10-CM | POA: Diagnosis not present

## 2017-05-07 DIAGNOSIS — Z30431 Encounter for routine checking of intrauterine contraceptive device: Secondary | ICD-10-CM | POA: Diagnosis not present

## 2017-05-07 DIAGNOSIS — Z3202 Encounter for pregnancy test, result negative: Secondary | ICD-10-CM | POA: Diagnosis not present

## 2017-06-25 DIAGNOSIS — F411 Generalized anxiety disorder: Secondary | ICD-10-CM | POA: Diagnosis not present

## 2017-06-25 DIAGNOSIS — F900 Attention-deficit hyperactivity disorder, predominantly inattentive type: Secondary | ICD-10-CM | POA: Diagnosis not present

## 2017-06-25 DIAGNOSIS — Z681 Body mass index (BMI) 19 or less, adult: Secondary | ICD-10-CM | POA: Diagnosis not present

## 2017-06-25 DIAGNOSIS — F331 Major depressive disorder, recurrent, moderate: Secondary | ICD-10-CM | POA: Diagnosis not present

## 2017-06-27 DIAGNOSIS — L723 Sebaceous cyst: Secondary | ICD-10-CM | POA: Diagnosis not present

## 2017-07-17 ENCOUNTER — Other Ambulatory Visit: Payer: Self-pay

## 2017-07-17 ENCOUNTER — Emergency Department (HOSPITAL_BASED_OUTPATIENT_CLINIC_OR_DEPARTMENT_OTHER): Payer: BLUE CROSS/BLUE SHIELD

## 2017-07-17 ENCOUNTER — Encounter (HOSPITAL_BASED_OUTPATIENT_CLINIC_OR_DEPARTMENT_OTHER): Payer: Self-pay | Admitting: *Deleted

## 2017-07-17 ENCOUNTER — Emergency Department (HOSPITAL_BASED_OUTPATIENT_CLINIC_OR_DEPARTMENT_OTHER)
Admission: EM | Admit: 2017-07-17 | Discharge: 2017-07-17 | Disposition: A | Discharge status: Home / Self Care | Payer: BLUE CROSS/BLUE SHIELD | Attending: Emergency Medicine | Admitting: Emergency Medicine

## 2017-07-17 DIAGNOSIS — Y998 Other external cause status: Secondary | ICD-10-CM | POA: Insufficient documentation

## 2017-07-17 DIAGNOSIS — Z79899 Other long term (current) drug therapy: Secondary | ICD-10-CM | POA: Diagnosis not present

## 2017-07-17 DIAGNOSIS — Y9389 Activity, other specified: Secondary | ICD-10-CM | POA: Insufficient documentation

## 2017-07-17 DIAGNOSIS — Y92195 Garage of other specified residential institution as the place of occurrence of the external cause: Secondary | ICD-10-CM | POA: Diagnosis not present

## 2017-07-17 DIAGNOSIS — S91311A Laceration without foreign body, right foot, initial encounter: Secondary | ICD-10-CM | POA: Diagnosis not present

## 2017-07-17 DIAGNOSIS — S91301A Unspecified open wound, right foot, initial encounter: Secondary | ICD-10-CM | POA: Diagnosis not present

## 2017-07-17 DIAGNOSIS — S99921A Unspecified injury of right foot, initial encounter: Secondary | ICD-10-CM | POA: Diagnosis not present

## 2017-07-17 DIAGNOSIS — W25XXXA Contact with sharp glass, initial encounter: Secondary | ICD-10-CM | POA: Insufficient documentation

## 2017-07-17 MED ORDER — NAPROXEN 375 MG PO TABS: 375.0000 mg | ORAL_TABLET | Freq: Two times a day (BID) | ORAL | 0 refills | Status: DC

## 2017-07-17 MED ORDER — DOXYCYCLINE HYCLATE 100 MG PO CAPS: 100.0000 mg | ORAL_CAPSULE | Freq: Two times a day (BID) | ORAL | 0 refills | Status: DC

## 2017-07-17 NOTE — ED Provider Notes (Signed)
[facility] EMERGENCY DEPARTMENT Provider Note   CSN: [id number] Arrival date & time: 07/17/17  1447     History   Chief Complaint Chief Complaint  Patient presents with  . Laceration    HPI Erica Webb is a 27 y.o. female with a hx of anemia who presents to the ED for laceration to right foot which occurred approximately 15 hours PTA. Patient states she was in the garage, accidentally stepped onto some glass on the ground resulting in the laceration. She was not wearing shoes at the time. She states she cleaned the area out last night with water and cleaning spray. She applied bandage. Re cleaned this AM with new bandage. 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 Rates pain a 10/10 in severity. Tried ibuprofen without significant relief yesterday. Denies any other areas of injury. Denies numbness or weakness. Last tetanus was 1 year ago.   HPI  Past Medical History:  Diagnosis Date  . Anemia   . Medical history non-contributory     Patient Active Problem List   Diagnosis Date Noted  . Normal labor 06/01/2016  . NSVD (normal spontaneous vaginal delivery) 06/01/2016  . Active labor at term 07/11/2013    Past Surgical History:  Procedure Laterality Date  . WISDOM TOOTH EXTRACTION       OB History    Gravida  3   Para  3   Term  3   Preterm      AB      Living  3     SAB      TAB      Ectopic      Multiple  0   Live Births  3            Home Medications    Prior to Admission medications   Medication Sig Start Date End Date Taking? Authorizing Provider  amphetamine-dextroamphetamine (ADDERALL) 20 MG tablet Take 20 mg by mouth daily.   Yes [provider]  ibuprofen (ADVIL,MOTRIN) 600 MG tablet Take 1 tablet (600 mg total) by mouth every 6 (six) hours as needed for moderate pain. 06/02/16   Julio Sicks, NP  sertraline (ZOLOFT) 25 MG tablet Take 100 mg by mouth 2  (two) times daily.     [provider]    Family History Family History  Problem Relation Age of Onset  . Diabetes Other     Social History Social History   Tobacco Use  . Smoking status: Never Smoker  . Smokeless tobacco: Never Used  Substance Use Topics  . Alcohol use: No    Comment: socialy  . Drug use: No     Allergies   Penicillins   Review of Systems Review of Systems  Constitutional: Negative for chills and fever.  Skin: Positive for wound (painful to R foot).  Neurological: Negative for weakness and numbness.     Physical Exam Updated Vital Signs BP 128/80   Pulse 90   Temp 98.5 F (36.9 C) (Oral)   Resp 16   Ht 5\' 7"  (1.702 m)   Wt 55.8 kg (123 lb)   SpO2 100%   BMI 19.26 kg/m   Physical Exam  Constitutional: She appears well-developed and well-nourished. No distress.  HENT:  Head: Normocephalic and atraumatic.  Eyes: Conjunctivae are normal. Right eye exhibits no discharge. Left eye exhibits no discharge.  Cardiovascular:  Pulses:  Dorsalis pedis pulses are 2+ on the right side, and 2+ on the left side.       Posterior tibial pulses are 2+ on the right side, and 2+ on the left side.  Musculoskeletal:  Lower extremities: patient has a 3cm laceration to the lateral aspect of the mid foot, no active bleeding, no appreciable foreign bodies, picture below. No obvious deformities, appreciable swelling, surrounding erythema, warmth, drainage, or ecchymosis. Patient has normal ROM to ankles and digits. She is tender to palpation in area of laceration otherwise nontender.   Neurological: She is alert.  Clear speech. Sensation grossly intact to bilateral lower extremities. 5/5 symmetric strength with plantar/dorsiflexion bilaterally.   Psychiatric: She has a normal mood and affect. Her behavior is normal. Thought content normal.  Nursing note and vitals reviewed.     ED Treatments / Results  Labs (all labs ordered are listed, but only  abnormal results are displayed) Labs Reviewed - No data to display  EKG None  Radiology Dg Foot Complete Right  Result Date: 07/17/2017 CLINICAL DATA:  Stepped on glass last night, open wound lateral foot question foreign body EXAM: RIGHT FOOT COMPLETE - 3+ VIEW COMPARISON:  None FINDINGS: Osseous mineralization normal. Joint spaces preserved. No acute fracture, dislocation, or bone destruction. Soft tissue swelling with small laceration at lateral aspect of mid RIGHT foot at the level of the base of the fifth metatarsal. No radiopaque foreign body identified. Small accessory ossification center is seen at the lateral margin of the calcaneocuboid joint. IMPRESSION: No acute osseous abnormalities or visualized radiopaque foreign body RIGHT foot. Electronically Signed   By: Ulyses SouthwardMark  Boles M.D.   On: 07/17/2017 15:18   Procedures Procedures (including critical care time)  Medications Ordered in ED Medications - No data to display   Initial Impression / Assessment and Plan / ED Course  I have reviewed the triage vital signs and the nursing notes.  Pertinent labs & imaging results that were available during my care of the patient were reviewed by me and considered in my medical decision making (see chart for details).   Patient presents with R foot laceration that occurred approximately 15 hours PTA after stepping on a piece of glass while barefoot. X-ray obtained per triage with no acute osseous abnormalities or visualized radiopaque foreign bodies. Discussed risk/benefits of loose closure with 1-2 sutures vs secondary closure with the patient, patient electing for secondary closure. Her tetanus is up to date. Will place her on doxycycline for infection prevention given patient is PCN allergic. Abx ointment and bandage applied in the ER. I discussed results, treatment plan, need for PCP follow-up, and return precautions with the patient. Provided opportunity for questions, patient confirmed  understanding and is in agreement with plan.   Findings and plan of care discussed with supervising physician Dr. Rush Landmarkegeler who is in agreement with plan.    Final Clinical Impressions(s) / ED Diagnoses   Final diagnoses:  Laceration of right foot, initial encounter    ED Discharge Orders        Ordered    doxycycline (VIBRAMYCIN) 100 MG capsule  2 times daily     07/17/17 1603    naproxen (NAPROSYN) 375 MG tablet  2 times daily     07/17/17 [address]1603       Deylan Canterbury, [place] ISamantha R, PA-C 07/17/17 1817    Tegeler, Canary Brimhristopher J, MD 07/18/17 [phone]

## 2017-07-17 NOTE — ED Notes (Signed)
Patient transported to X-ray 

## 2017-07-17 NOTE — ED Triage Notes (Signed)
Pt c/o lac to right foot x 14 hrs ago on glass

## 2017-07-17 NOTE — Discharge Instructions (Addendum)
You were seen in the emergency department today for a laceration to your right foot.  The x-ray did not show retained glass or a fracture dislocation of your foot.  Given the timeframe since the laceration has occurred we did not close the wound. Please keep this clean and dry. You may apply antibiotic ointment to this area. We are putting you on doxycyline, an antibiotic, to help prevent infection. We are starting you on naproxen for pain. Naproxen is a nonsteroidal anti-inflammatory medication that will help with pain and swelling. Be sure to take this medication as prescribed with food, 1 pill every 12 hours,  It should be taken with food, as it can cause stomach upset, and more seriously, stomach bleeding. Do not take other nonsteroidal anti-inflammatory medications with this such as Advil, Motrin, or Aleve.   You may take tylenol per over the counter dosing instructions with this medicine safely.   We have prescribed you new medication(s) today. Discuss the medications prescribed today with your pharmacist as they can have adverse effects and interactions with your other medicines including over the counter and prescribed medications. Seek medical evaluation if you start to experience new or abnormal symptoms after taking one of these medicines, seek care immediately if you start to experience difficulty breathing, feeling of your throat closing, facial swelling, or rash as these could be indications of a more serious allergic reaction   Follow-up with your primary care provider in 3 days for a wound recheck.  Return to the ER for new or worsening symptoms including but not limited to slight drainage from the wound, redness around the wound, fever, chills, or any other concerns.

## 2017-08-04 ENCOUNTER — Encounter (HOSPITAL_COMMUNITY): Payer: Self-pay | Admitting: Emergency Medicine

## 2017-08-04 ENCOUNTER — Other Ambulatory Visit: Payer: Self-pay

## 2017-08-04 ENCOUNTER — Emergency Department (HOSPITAL_COMMUNITY)
Admission: EM | Admit: 2017-08-04 | Discharge: 2017-08-04 | Disposition: A | Discharge status: Home / Self Care | Payer: BLUE CROSS/BLUE SHIELD | Attending: Emergency Medicine | Admitting: Emergency Medicine

## 2017-08-04 DIAGNOSIS — R55 Syncope and collapse: Secondary | ICD-10-CM | POA: Diagnosis not present

## 2017-08-04 DIAGNOSIS — F1010 Alcohol abuse, uncomplicated: Secondary | ICD-10-CM | POA: Diagnosis not present

## 2017-08-04 DIAGNOSIS — R1110 Vomiting, unspecified: Secondary | ICD-10-CM | POA: Diagnosis not present

## 2017-08-04 DIAGNOSIS — Z79899 Other long term (current) drug therapy: Secondary | ICD-10-CM | POA: Diagnosis not present

## 2017-08-04 DIAGNOSIS — F10929 Alcohol use, unspecified with intoxication, unspecified: Secondary | ICD-10-CM | POA: Diagnosis not present

## 2017-08-04 DIAGNOSIS — Z789 Other specified health status: Secondary | ICD-10-CM
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
## 2017-08-04 NOTE — ED Notes (Signed)
Patient is awake-covered in vomit and urine-clothing removed and gown placed-patient retching
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
## 2017-08-04 NOTE — ED Notes (Signed)
Warm blankets  

## 2017-08-04 NOTE — Discharge Instructions (Addendum)
Recheck as needed °

## 2017-08-27 DIAGNOSIS — Z3169 Encounter for other general counseling and advice on procreation: Secondary | ICD-10-CM | POA: Diagnosis not present

## 2017-08-27 DIAGNOSIS — Z30432 Encounter for removal of intrauterine contraceptive device: Secondary | ICD-10-CM | POA: Diagnosis not present

## 2017-09-18 DIAGNOSIS — N912 Amenorrhea, unspecified: Secondary | ICD-10-CM | POA: Diagnosis not present

## 2017-09-24 DIAGNOSIS — Z23 Encounter for immunization: Secondary | ICD-10-CM | POA: Diagnosis not present

## 2017-09-24 DIAGNOSIS — Z6823 Body mass index (BMI) 23.0-23.9, adult: Secondary | ICD-10-CM | POA: Diagnosis not present

## 2017-09-24 DIAGNOSIS — F900 Attention-deficit hyperactivity disorder, predominantly inattentive type: Secondary | ICD-10-CM | POA: Diagnosis not present

## 2017-09-24 DIAGNOSIS — F331 Major depressive disorder, recurrent, moderate: Secondary | ICD-10-CM | POA: Diagnosis not present

## 2017-09-24 DIAGNOSIS — F411 Generalized anxiety disorder: Secondary | ICD-10-CM | POA: Diagnosis not present

## 2017-09-25 DIAGNOSIS — N912 Amenorrhea, unspecified: Secondary | ICD-10-CM | POA: Diagnosis not present

## 2017-10-01 DIAGNOSIS — N912 Amenorrhea, unspecified: Secondary | ICD-10-CM | POA: Diagnosis not present

## 2017-10-10 DIAGNOSIS — N911 Secondary amenorrhea: Secondary | ICD-10-CM | POA: Diagnosis not present

## 2017-10-15 DIAGNOSIS — N911 Secondary amenorrhea: Secondary | ICD-10-CM | POA: Diagnosis not present

## 2017-10-26 DIAGNOSIS — Z3482 Encounter for supervision of other normal pregnancy, second trimester: Secondary | ICD-10-CM | POA: Diagnosis not present

## 2017-10-26 DIAGNOSIS — Z3483 Encounter for supervision of other normal pregnancy, third trimester: Secondary | ICD-10-CM | POA: Diagnosis not present

## 2017-10-30 DIAGNOSIS — Z3685 Encounter for antenatal screening for Streptococcus B: Secondary | ICD-10-CM | POA: Diagnosis not present

## 2017-10-30 DIAGNOSIS — Z3482 Encounter for supervision of other normal pregnancy, second trimester: Secondary | ICD-10-CM | POA: Diagnosis not present

## 2017-10-30 DIAGNOSIS — Z3481 Encounter for supervision of other normal pregnancy, first trimester: Secondary | ICD-10-CM | POA: Diagnosis not present

## 2017-10-30 LAB — OB RESULTS CONSOLE HIV ANTIBODY (ROUTINE TESTING): HIV: NONREACTIVE

## 2017-10-30 LAB — OB RESULTS CONSOLE GC/CHLAMYDIA
Chlamydia: NEGATIVE
Gonorrhea: NEGATIVE

## 2017-10-30 LAB — OB RESULTS CONSOLE RUBELLA ANTIBODY, IGM: Rubella: NON-IMMUNE/NOT IMMUNE

## 2017-10-30 LAB — OB RESULTS CONSOLE RPR: RPR: NONREACTIVE

## 2017-10-30 LAB — OB RESULTS CONSOLE HEPATITIS B SURFACE ANTIGEN: Hepatitis B Surface Ag: NEGATIVE

## 2017-11-07 DIAGNOSIS — Z3491 Encounter for supervision of normal pregnancy, unspecified, first trimester: Secondary | ICD-10-CM | POA: Diagnosis not present

## 2017-11-07 DIAGNOSIS — Z113 Encounter for screening for infections with a predominantly sexual mode of transmission: Secondary | ICD-10-CM | POA: Diagnosis not present

## 2017-11-07 DIAGNOSIS — Z3481 Encounter for supervision of other normal pregnancy, first trimester: Secondary | ICD-10-CM | POA: Diagnosis not present

## 2017-11-21 DIAGNOSIS — Z3682 Encounter for antenatal screening for nuchal translucency: Secondary | ICD-10-CM | POA: Diagnosis not present

## 2017-11-21 DIAGNOSIS — Z3491 Encounter for supervision of normal pregnancy, unspecified, first trimester: Secondary | ICD-10-CM | POA: Diagnosis not present

## 2017-12-24 DIAGNOSIS — F331 Major depressive disorder, recurrent, moderate: Secondary | ICD-10-CM | POA: Diagnosis not present

## 2017-12-24 DIAGNOSIS — F411 Generalized anxiety disorder: Secondary | ICD-10-CM | POA: Diagnosis not present

## 2017-12-24 DIAGNOSIS — G44209 Tension-type headache, unspecified, not intractable: Secondary | ICD-10-CM | POA: Diagnosis not present

## 2017-12-24 DIAGNOSIS — F900 Attention-deficit hyperactivity disorder, predominantly inattentive type: Secondary | ICD-10-CM | POA: Diagnosis not present

## 2018-01-07 DIAGNOSIS — Z363 Encounter for antenatal screening for malformations: Secondary | ICD-10-CM | POA: Diagnosis not present

## 2018-01-07 DIAGNOSIS — Z3A19 19 weeks gestation of pregnancy: Secondary | ICD-10-CM | POA: Diagnosis not present

## 2018-01-07 DIAGNOSIS — Z3481 Encounter for supervision of other normal pregnancy, first trimester: Secondary | ICD-10-CM | POA: Diagnosis not present

## 2018-01-07 DIAGNOSIS — Z3482 Encounter for supervision of other normal pregnancy, second trimester: Secondary | ICD-10-CM | POA: Diagnosis not present

## 2018-02-06 NOTE — L&D Delivery Note (Signed)
Delivery Note Progressed to complete dilation and started feeling pressure.  Pushed well for a short time to SVD.  Variable decels with contractions in second stage with excellent response to scalp stimulation  At 10:16 PM a viable and healthy female was delivered via Vaginal, Spontaneous (Presentation: ROA ).  APGAR: 8, 9; weight  .   Placenta status: spontaneous and grossly intact with 3 vessel  Cord:  with the following complications: none  Anesthesia:  Epidural and local Episiotomy: None Lacerations:  none Suture Repair: n/a Est. Blood Loss (mL):  410  Mom to postpartum.  Baby to Couplet care / Skin to Skin.  Wynelle Bourgeois 06/04/2018, 10:36 PM  [facility] then MAU Prenatal care  Please schedule this patient for Postpartum visit in: 4 weeks with the following provider: Any provider For C/S patients schedule nurse incision check in weeks 2 weeks: no Low risk pregnancy complicated by: none Delivery mode:  SVD Anticipated Birth Control:  other/unsure PP Procedures needed: none  Schedule Integrated BH visit: no

## 2018-03-25 DIAGNOSIS — F900 Attention-deficit hyperactivity disorder, predominantly inattentive type: Secondary | ICD-10-CM | POA: Diagnosis not present

## 2018-03-25 DIAGNOSIS — Z6826 Body mass index (BMI) 26.0-26.9, adult: Secondary | ICD-10-CM | POA: Diagnosis not present

## 2018-03-25 DIAGNOSIS — F411 Generalized anxiety disorder: Secondary | ICD-10-CM | POA: Diagnosis not present

## 2018-03-25 DIAGNOSIS — F330 Major depressive disorder, recurrent, mild: Secondary | ICD-10-CM | POA: Diagnosis not present

## 2018-05-14 ENCOUNTER — Inpatient Hospital Stay (HOSPITAL_COMMUNITY)
Admission: AD | Admit: 2018-05-14 | Discharge: 2018-05-14 | Disposition: A | Discharge status: Home / Self Care | Payer: BLUE CROSS/BLUE SHIELD | Attending: Obstetrics & Gynecology | Admitting: Obstetrics & Gynecology

## 2018-05-14 ENCOUNTER — Other Ambulatory Visit: Payer: Self-pay

## 2018-05-14 ENCOUNTER — Encounter (HOSPITAL_COMMUNITY): Payer: Self-pay | Admitting: *Deleted

## 2018-05-14 DIAGNOSIS — Z3A37 37 weeks gestation of pregnancy: Secondary | ICD-10-CM | POA: Diagnosis not present

## 2018-05-14 DIAGNOSIS — O471 False labor at or after 37 completed weeks of gestation: Secondary | ICD-10-CM | POA: Diagnosis not present

## 2018-05-14 DIAGNOSIS — O479 False labor, unspecified: Secondary | ICD-10-CM

## 2018-05-14 LAB — WET PREP, GENITAL
Clue Cells Wet Prep HPF POC: NONE SEEN
Sperm: NONE SEEN
Trich, Wet Prep: NONE SEEN
Yeast Wet Prep HPF POC: NONE SEEN

## 2018-05-14 LAB — GROUP B STREP BY PCR: Group B strep by PCR: NEGATIVE

## 2018-05-14 NOTE — Discharge Instructions (Signed)
Reasons to return to MAU at [facility]:  1.  Contractions are  5 minutes apart or less, each last 1 minute, these have been going on for 1-2 hours, and you cannot walk or talk during them 2.  You have a large gush of fluid, or a trickle of fluid that will not stop and you have to wear a pad 3.  You have bleeding that is bright red, heavier than spotting--like menstrual bleeding (spotting can be normal in early labor or after a check of your cervix) 4.  You do not feel the baby moving like he/she normally does  

## 2018-05-14 NOTE — MAU Provider Note (Signed)
S: Ms. NAZLY DIJULIO is a 28 y.o. [phone] at [redacted]w[redacted]d  who presents to MAU today for labor evaluation.     Cervical exam by RN:  Dilation: 2.5 Effacement (%): 40 Station: -3 Presentation: Vertex Exam by:: Ginnie Smart RN  Fetal Monitoring: Baseline: 135 Variability: moderate Accelerations: present Decelerations: none Contractions: Q3-4 minutes  MDM Discussed patient with RN. NST reviewed.   A: [facility] at [redacted]w[redacted]d  False labor  P: Discharge home Labor precautions and kick counts included in AVS GBS/GC collected today Follow up prenatal care arranged with [facility] since pt lost her insurance and is no longer with her prenatal provider Patient may return to MAU as needed or when in labor   Hurshel Party, CNM 05/14/2018 4:23 PM

## 2018-05-14 NOTE — MAU Note (Signed)
Pt presents to MAU with complaints of contractions that started around 9 this morning. Pt had Care previously at [facility] and was told a couple of months ago they did not accept her insurance. Has not been able to go to a physician since then due to not knowing what to do. Denies any VB or LOF

## 2018-05-15 LAB — GC/CHLAMYDIA PROBE AMP (~~LOC~~) NOT AT ARMC
Chlamydia: NEGATIVE
Neisseria Gonorrhea: NEGATIVE

## 2018-05-21 ENCOUNTER — Telehealth: Payer: Self-pay | Admitting: Advanced Practice Midwife

## 2018-05-21 NOTE — Telephone Encounter (Signed)
[id number] @ 37 weeks was seen in MAU, limited prenatal care due to loss of insurance.  Pt wanted to establish care with [facility] but due to COVID 19, there are no late transfers to our practice. Called and left message for pt regarding this policy, letting her know MAU and the [facility] at [facility] is open 24 hours/day.    GBS (PCR), GCC and ABO/Rh were collected at MAU visit.  Prior OB labs reviewed from previous provider and are in chart.

## 2018-05-21 NOTE — Telephone Encounter (Signed)
-----   Message from Hurshel Party, CNM sent at 05/20/2018  8:25 AM EDT ----- Call pt and let her know there are no late transfers to our practice due to COVID 19.

## 2018-05-30 ENCOUNTER — Other Ambulatory Visit: Payer: Self-pay

## 2018-05-30 ENCOUNTER — Encounter (HOSPITAL_COMMUNITY): Payer: Self-pay

## 2018-05-30 ENCOUNTER — Inpatient Hospital Stay (HOSPITAL_COMMUNITY)
Admission: AD | Admit: 2018-05-30 | Discharge: 2018-05-30 | Disposition: A | Discharge status: Home / Self Care | Payer: BLUE CROSS/BLUE SHIELD | Attending: Obstetrics and Gynecology | Admitting: Obstetrics and Gynecology

## 2018-05-30 DIAGNOSIS — O479 False labor, unspecified: Secondary | ICD-10-CM

## 2018-05-30 DIAGNOSIS — Z3A39 39 weeks gestation of pregnancy: Secondary | ICD-10-CM | POA: Diagnosis not present

## 2018-05-30 HISTORY — DX: Depression, unspecified: F32.A

## 2018-05-30 HISTORY — DX: Major depressive disorder, single episode, unspecified: F32.9

## 2018-05-30 HISTORY — DX: Anxiety disorder, unspecified: F41.9

## 2018-05-30 LAB — URINALYSIS, ROUTINE W REFLEX MICROSCOPIC
Bilirubin Urine: NEGATIVE
Glucose, UA: NEGATIVE mg/dL
Hgb urine dipstick: NEGATIVE
Ketones, ur: NEGATIVE mg/dL
Nitrite: NEGATIVE
Protein, ur: NEGATIVE mg/dL
Specific Gravity, Urine: 1.02 (ref 1.005–1.030)
pH: 6 (ref 5.0–8.0)

## 2018-05-30 NOTE — MAU Note (Signed)
Pt reports she has been having pelvic pressure and back pain off and on. Has not had prenatal care for the past few months due to insurance. Checked last week in MAU 2-3cm. Good fetal movement felt. Denies any vag bleeding reports some vaginal discharge and mucus.

## 2018-05-30 NOTE — MAU Note (Signed)
Cervix rechecked - remained same. Notified Gerrit Heck CNM. Received d/c orders.  Gave d/c instructions to patient and explained what to return to MAU for ie labor instructions, leaking fluid, bleeding, decreased FM- voiced understanding.  Pt left before getting her to sign for the d/c instructions.  Vitals stable.

## 2018-05-30 NOTE — Discharge Instructions (Signed)
Braxton Hicks Contractions Contractions of the uterus can occur throughout pregnancy, but they are not always a sign that you are in labor. You may have practice contractions called Braxton Hicks contractions. These false labor contractions are sometimes confused with true labor. What are Braxton Hicks contractions? Braxton Hicks contractions are tightening movements that occur in the muscles of the uterus before labor. Unlike true labor contractions, these contractions do not result in opening (dilation) and thinning of the cervix. Toward the end of pregnancy (32-34 weeks), Braxton Hicks contractions can happen more often and may become stronger. These contractions are sometimes difficult to tell apart from true labor because they can be very uncomfortable. You should not feel embarrassed if you go to the hospital with false labor. Sometimes, the only way to tell if you are in true labor is for your health care provider to look for changes in the cervix. The health care provider will do a physical exam and may monitor your contractions. If you are not in true labor, the exam should show that your cervix is not dilating and your water has not broken. If there are no other health problems associated with your pregnancy, it is completely safe for you to be sent home with false labor. You may continue to have Braxton Hicks contractions until you go into true labor. How to tell the difference between true labor and false labor True labor  Contractions last 30-70 seconds.  Contractions become very regular.  Discomfort is usually felt in the top of the uterus, and it spreads to the lower abdomen and low back.  Contractions do not go away with walking.  Contractions usually become more intense and increase in frequency.  The cervix dilates and gets thinner. False labor  Contractions are usually shorter and not as strong as true labor contractions.  Contractions are usually irregular.  Contractions  are often felt in the front of the lower abdomen and in the groin.  Contractions may go away when you walk around or change positions while lying down.  Contractions get weaker and are shorter-lasting as time goes on.  The cervix usually does not dilate or become thin. Follow these instructions at home:   Take over-the-counter and prescription medicines only as told by your health care provider.  Keep up with your usual exercises and follow other instructions from your health care provider.  Eat and drink lightly if you think you are going into labor.  If Braxton Hicks contractions are making you uncomfortable: ? Change your position from lying down or resting to walking, or change from walking to resting. ? Sit and rest in a tub of warm water. ? Drink enough fluid to keep your urine pale yellow. Dehydration may cause these contractions. ? Do slow and deep breathing several times an hour.  Keep all follow-up prenatal visits as told by your health care provider. This is important. Contact a health care provider if:  You have a fever.  You have continuous pain in your abdomen. Get help right away if:  Your contractions become stronger, more regular, and closer together.  You have fluid leaking or gushing from your vagina.  You pass blood-tinged mucus (bloody show).  You have bleeding from your vagina.  You have low back pain that you never had before.  You feel your baby's head pushing down and causing pelvic pressure.  Your baby is not moving inside you as much as it used to. Summary  Contractions that occur before labor are   called Braxton Hicks contractions, false labor, or practice contractions.  Braxton Hicks contractions are usually shorter, weaker, farther apart, and less regular than true labor contractions. True labor contractions usually become progressively stronger and regular, and they become more frequent.  Manage discomfort from Braxton Hicks contractions  by changing position, resting in a warm bath, drinking plenty of water, or practicing deep breathing. This information is not intended to replace advice given to you by your health care provider. Make sure you discuss any questions you have with your health care provider. Document Released: 06/08/2016 Document Revised: 11/07/2016 Document Reviewed: 06/08/2016 Elsevier Interactive Patient Education  2019 [employer] Inc[employer]  

## 2018-05-30 NOTE — MAU Provider Note (Signed)
S: Ms. Erica Webb is a 28 y.o. [phone] at [redacted]w[redacted]d  who presents to MAU today for labor evaluation.     Cervical exam by RN:  Dilation: 3 Effacement (%): 50 Cervical Position: Posterior Station: -3 Presentation: Vertex Exam by:: benji stanley RN  Fetal Monitoring: Baseline: 140 Variability: Moderate Accelerations: Present Decelerations: None Contractions: Q2-64min  MDM Discussed patient with RN. NST reviewed.   A: [facility] at [redacted]w[redacted]d  Uterine Contractions without Cervical Change  P: Discharge home Labor precautions and kick counts included in AVS Patient may return to MAU as needed or when in labor   Gerrit Heck, [place] 05/30/2018 12:57 PM

## 2018-06-04 ENCOUNTER — Inpatient Hospital Stay (HOSPITAL_COMMUNITY)
Admission: AD | Admit: 2018-06-04 | Discharge: 2018-06-06 | DRG: 807 | Disposition: A | Discharge status: Home / Self Care | Payer: BLUE CROSS/BLUE SHIELD | Attending: Family Medicine | Admitting: Family Medicine

## 2018-06-04 ENCOUNTER — Inpatient Hospital Stay (HOSPITAL_COMMUNITY): Payer: BLUE CROSS/BLUE SHIELD | Admitting: Anesthesiology

## 2018-06-04 ENCOUNTER — Other Ambulatory Visit: Payer: Self-pay

## 2018-06-04 ENCOUNTER — Encounter (HOSPITAL_COMMUNITY): Payer: Self-pay | Admitting: *Deleted

## 2018-06-04 DIAGNOSIS — Z880 Allergy status to penicillin: Secondary | ICD-10-CM

## 2018-06-04 DIAGNOSIS — O99344 Other mental disorders complicating childbirth: Principal | ICD-10-CM | POA: Diagnosis present

## 2018-06-04 DIAGNOSIS — D649 Anemia, unspecified: Secondary | ICD-10-CM | POA: Diagnosis present

## 2018-06-04 DIAGNOSIS — Z87891 Personal history of nicotine dependence: Secondary | ICD-10-CM | POA: Diagnosis not present

## 2018-06-04 DIAGNOSIS — Z3A40 40 weeks gestation of pregnancy: Secondary | ICD-10-CM

## 2018-06-04 DIAGNOSIS — F419 Anxiety disorder, unspecified: Secondary | ICD-10-CM | POA: Diagnosis present

## 2018-06-04 DIAGNOSIS — O9902 Anemia complicating childbirth: Secondary | ICD-10-CM | POA: Diagnosis present

## 2018-06-04 DIAGNOSIS — Z9189 Other specified personal risk factors, not elsewhere classified: Secondary | ICD-10-CM

## 2018-06-04 DIAGNOSIS — F329 Major depressive disorder, single episode, unspecified: Secondary | ICD-10-CM | POA: Diagnosis present

## 2018-06-04 DIAGNOSIS — O480 Post-term pregnancy: Secondary | ICD-10-CM | POA: Diagnosis not present

## 2018-06-04 DIAGNOSIS — O26893 Other specified pregnancy related conditions, third trimester: Secondary | ICD-10-CM | POA: Diagnosis not present

## 2018-06-04 DIAGNOSIS — O0933 Supervision of pregnancy with insufficient antenatal care, third trimester: Secondary | ICD-10-CM

## 2018-06-04 LAB — CBC
HCT: 34 % — ABNORMAL LOW (ref 36.0–46.0)
Hemoglobin: 11.2 g/dL — ABNORMAL LOW (ref 12.0–15.0)
MCH: 27.9 pg (ref 26.0–34.0)
MCHC: 32.9 g/dL (ref 30.0–36.0)
MCV: 84.8 fL (ref 80.0–100.0)
Platelets: 150 10*3/uL (ref 150–400)
RBC: 4.01 MIL/uL (ref 3.87–5.11)
RDW: 13.4 % (ref 11.5–15.5)
WBC: 13.8 10*3/uL — ABNORMAL HIGH (ref 4.0–10.5)
nRBC: 0 % (ref 0.0–0.2)

## 2018-06-04 LAB — TYPE AND SCREEN
ABO/RH(D): A POS
Antibody Screen: NEGATIVE

## 2018-06-04 MED ORDER — LIDOCAINE HCL (PF) 1 % IJ SOLN
30.0000 mL | INTRAMUSCULAR | Status: AC | PRN
  Administered 2018-06-04: 30 mL via SUBCUTANEOUS
  Filled 2018-06-04: qty 30

## 2018-06-04 MED ORDER — DIPHENHYDRAMINE HCL 50 MG/ML IJ SOLN: 12.5000 mg | INTRAMUSCULAR | Status: DC | PRN

## 2018-06-04 MED ORDER — OXYTOCIN 40 UNITS IN NORMAL SALINE INFUSION - SIMPLE MED
2.5000 [IU]/h | INTRAVENOUS | Status: DC
  Administered 2018-06-04: 2.5 [IU]/h via INTRAVENOUS
  Filled 2018-06-04: qty 1000

## 2018-06-04 MED ORDER — PHENYLEPHRINE 40 MCG/ML (10ML) SYRINGE FOR IV PUSH (FOR BLOOD PRESSURE SUPPORT)
80.0000 ug | PREFILLED_SYRINGE | INTRAVENOUS | Status: DC | PRN
  Filled 2018-06-04 (×2): qty 10

## 2018-06-04 MED ORDER — PHENYLEPHRINE 40 MCG/ML (10ML) SYRINGE FOR IV PUSH (FOR BLOOD PRESSURE SUPPORT)
80.0000 ug | PREFILLED_SYRINGE | INTRAVENOUS | Status: DC | PRN
  Filled 2018-06-04: qty 10

## 2018-06-04 MED ORDER — FENTANYL-BUPIVACAINE-NACL 0.5-0.125-0.9 MG/250ML-% EP SOLN
12.0000 mL/h | EPIDURAL | Status: DC | PRN
  Filled 2018-06-04 (×2): qty 250

## 2018-06-04 MED ORDER — LACTATED RINGERS IV SOLN
INTRAVENOUS | Status: DC
  Administered 2018-06-04: 19:00:00 via INTRAVENOUS
  Filled 2018-06-04 (×3): qty 1000

## 2018-06-04 MED ORDER — EPHEDRINE 5 MG/ML INJ
10.0000 mg | INTRAVENOUS | Status: DC | PRN
  Filled 2018-06-04: qty 2

## 2018-06-04 MED ORDER — FLEET ENEMA 7-19 GM/118ML RE ENEM
1.0000 | ENEMA | RECTAL | Status: DC | PRN
  Filled 2018-06-04: qty 1

## 2018-06-04 MED ORDER — LACTATED RINGERS IV SOLN
500.0000 mL | INTRAVENOUS | Status: DC | PRN
  Filled 2018-06-04: qty 1000

## 2018-06-04 MED ORDER — OXYTOCIN BOLUS FROM INFUSION
500.0000 mL | Freq: Once | INTRAVENOUS | Status: AC
  Administered 2018-06-04: 500 mL via INTRAVENOUS

## 2018-06-04 MED ORDER — SOD CITRATE-CITRIC ACID 500-334 MG/5ML PO SOLN: 30.0000 mL | ORAL | Status: DC | PRN

## 2018-06-04 MED ORDER — SODIUM CHLORIDE (PF) 0.9 % IJ SOLN
INTRAMUSCULAR | Status: DC | PRN
  Administered 2018-06-04: 12 mL/h via EPIDURAL

## 2018-06-04 MED ORDER — LIDOCAINE HCL (PF) 1 % IJ SOLN
INTRAMUSCULAR | Status: DC | PRN
  Administered 2018-06-04: 5 mL via EPIDURAL

## 2018-06-04 MED ORDER — LACTATED RINGERS IV SOLN
500.0000 mL | Freq: Once | INTRAVENOUS | Status: AC
  Administered 2018-06-04: 500 mL via INTRAVENOUS

## 2018-06-04 MED ORDER — ONDANSETRON HCL 4 MG/2ML IJ SOLN: 4.0000 mg | Freq: Four times a day (QID) | INTRAMUSCULAR | Status: DC | PRN

## 2018-06-04 MED ORDER — FENTANYL CITRATE (PF) 100 MCG/2ML IJ SOLN: 100.0000 ug | INTRAMUSCULAR | Status: DC | PRN

## 2018-06-04 NOTE — Discharge Summary (Signed)
Postpartum Discharge Summary     Patient Name: Erica Webb DOB: 1990/10/06 MRN: [id number]  Date of admission: 06/04/2018 Delivering Provider: Aviva Signs   Date of discharge: 06/06/2018  Admitting diagnosis: CTX Intrauterine pregnancy: [redacted]w[redacted]d     Secondary diagnosis:  Active Problems:   Normal labor   Indication for care in labor or delivery   Insufficient prenatal care in third trimester  Additional problems: none     Discharge diagnosis: Term Pregnancy Delivered                                                                                                Post partum procedures:none  Augmentation: none  Complications: None  Hospital course:  Onset of Labor With Vaginal Delivery     28 y.o. yo [phone] at [redacted]w[redacted]d was admitted in Active Labor on 06/04/2018. Patient had an uncomplicated labor course as follows:  She progressed steadily to 6cm then progressed rapidly to complete dilation. Membrane Rupture Time/Date: just prior to delivery Intrapartum Procedures: Episiotomy: None [1]                                         Lacerations:  None [1]  none Patient had a delivery of a Viable infant. 06/04/2018  Information for the patient's newborn:  Erica, Webb [[id number]]  Delivery Method: Vaginal, Spontaneous(Filed from Delivery Summary)  Baby is named Erica Webb  [name] had an uncomplicated postpartum course.  She is ambulating, tolerating a regular diet, passing flatus, and urinating well. Patient is discharged home in stable condition on 06/06/18. Infant to have a circumcision prior to delivery.   Magnesium Sulfate recieved: No BMZ received: No  Physical exam  Vitals:   06/05/18 0933 06/05/18 1306 06/05/18 2332 06/06/18 0830  BP: 118/70 115/80 122/71 126/68  Pulse: 86 76 64 76  Resp: 16 20  18   Temp: 97.8 F (36.6 C) 98.5 F (36.9 C) 98.4 F (36.9 C) 98.2 F (36.8 C)  TempSrc: Oral Oral Oral Oral  SpO2: 100%     Weight:      Height:        General: alert and cooperative Lochia: appropriate Uterine Fundus: firm Incision: N/A DVT Evaluation: No evidence of DVT seen on physical exam. Labs: Lab Results  Component Value Date   WBC 13.8 (H) 06/04/2018   HGB 11.2 (L) 06/04/2018   HCT 34.0 (L) 06/04/2018   MCV 84.8 06/04/2018   PLT 150 06/04/2018   CMP 02/22/2007  Glucose 88  BUN 9  Creatinine 0.75  Sodium 138  Potassium 3.5  Chloride 104  CO2 23  Calcium 9.2    Discharge instruction: per After Visit Summary and "Baby and Me Booklet".  After visit meds:  Allergies as of 06/06/2018      Reactions   Penicillins Anaphylaxis   Pt repots laryngeal edema Has patient had a PCN reaction causing immediate rash, facial/tongue/throat swelling, SOB or lightheadedness with hypotension: No Has patient had a PCN reaction causing severe rash involving  mucus membranes or skin necrosis: No Has patient had a PCN reaction that required hospitalization No Has patient had a PCN reaction occurring within the last 10 years: No If all of the above answers are "NO", then may proceed with Cephalosporin use.      Medication List    TAKE these medications   ibuprofen 600 MG tablet Commonly known as:  ADVIL Take 1 tablet (600 mg total) by mouth every 6 (six) hours as needed.   prenatal multivitamin Tabs tablet Take 1 tablet by mouth daily at 12 noon.   sertraline 25 MG tablet Commonly known as:  ZOLOFT Take 100 mg by mouth 2 (two) times daily.       Diet: routine diet  Activity: Advance as tolerated. Pelvic rest for 6 weeks.   Outpatient follow up:4 weeks Follow up Appt:No future appointments. Follow up Visit: Follow-up Information    [facility]. Schedule an appointment as soon as possible for a visit in 4 week(s).   Specialty:  Obstetrics and Gynecology Why:  for your postpartum appointment Contact information: [address], [address] [id number] [place]  [place] [phone]           Had care at [facility] then lost insurance >> MAU Prenatal care  Please schedule this patient for Postpartum visit in: 4 weeks with the following provider: Any provider For C/S patients schedule nurse incision check in weeks 2 weeks: no Low risk pregnancy complicated by: none Delivery mode:  SVD Anticipated Birth Control:  other/unsure PP Procedures needed: none  Schedule Integrated BH visit: no  Newborn Data: Live born female  Birth Weight: 3230 gm APGAR: 8, 9  Newborn Delivery   Birth date/time:  06/04/2018 22:16:00 Delivery type:  Vaginal, Spontaneous     Baby Feeding: Breast Disposition:home with mother   06/06/2018 Erica Webb, CNM  9:02 AM

## 2018-06-04 NOTE — MAU Note (Signed)
Contractions started around 3, every 4-6 min now.  Light blood when she used the restroom.  Also a watery d/c noted about 3. Was 3 cm when last checked.

## 2018-06-04 NOTE — H&P (Signed)
OBSTETRIC ADMISSION HISTORY AND PHYSICAL  Erica Webb is a 28 y.o. female [phone] with IUP at [redacted]w[redacted]d by LMP presenting for SOL.   Reports fetal movement. Denies vaginal bleeding. Thought potentially leakage of fluids but bulging bag palpated on exam in MAU.   She received her prenatal care at [facility] but then discharged from practice for financial reasons. Has been seen in MAU for last couple months of pregnancy but otherwise no regular care..  Support person in labor: Partner  Ultrasounds . Anatomy U/S: unavailable, reported as normal   Prenatal History/Complications: . Limited 3rd trimester prenatal care   Past Medical History: Past Medical History:  Diagnosis Date  . Anemia   . Anxiety    takes zoloft for hx of anxiety and depression  . Depression    takes zoloft for hx of anxiety and depression, doing well  . Medical history non-contributory     Past Surgical History: Past Surgical History:  Procedure Laterality Date  . NO PAST SURGERIES    . WISDOM TOOTH EXTRACTION      Obstetrical History: OB History    Gravida  4   Para  3   Term  3   Preterm      AB      Living  3     SAB      TAB      Ectopic      Multiple  0   Live Births  3           Social History: Social History   Socioeconomic History  . Marital status: Married    Spouse name: Not on file  . Number of children: 3  . Years of education: Not on file  . Highest education level: Not on file  Occupational History  . Not on file  Social Needs  . Financial resource strain: Somewhat hard  . Food insecurity:    Worry: Never true    Inability: Never true  . Transportation needs:    Medical: No    Non-medical: No  Tobacco Use  . Smoking status: Former Games developer  . Smokeless tobacco: Never Used  . Tobacco comment: social smoker, not with preg  Substance and Sexual Activity  . Alcohol use: Not Currently    Comment: socialy  . Drug use: No  . Sexual activity: Yes     Birth control/protection: I.U.D.  Lifestyle  . Physical activity:    Days per week: 7 days    Minutes per session: 150+ min  . Stress: Not at all  Relationships  . Social connections:    Talks on phone: Never    Gets together: Once a week    Attends religious service: Never    Active member of club or organization: No    Attends meetings of clubs or organizations: Never    Relationship status: Married  Other Topics Concern  . Not on file  Social History Narrative  . Not on file    Family History: Family History  Problem Relation Age of Onset  . Diabetes Other   . Healthy Mother     Allergies: Allergies  Allergen Reactions  . Penicillins Anaphylaxis    Pt repots laryngeal edema Has patient had a PCN reaction causing immediate rash, facial/tongue/throat swelling, SOB or lightheadedness with hypotension: No Has patient had a PCN reaction causing severe rash involving mucus membranes or skin necrosis: No Has patient had a PCN reaction that required hospitalization No Has patient had  a PCN reaction occurring within the last 10 years: No If all of the above answers are "NO", then may proceed with Cephalosporin use.    Medications Prior to Admission  Medication Sig Dispense Refill Last Dose  . sertraline (ZOLOFT) 25 MG tablet Take 100 mg by mouth 2 (two) times daily.    05/30/2018 at Unknown time     Review of Systems  All systems reviewed and negative except as stated in HPI  Blood pressure 120/68, pulse 96, temperature 98.6 F (37 C), temperature source Oral, resp. rate 20, height 5\' 7"  (1.702 m), weight 80.3 kg, last menstrual period 08/26/2017, SpO2 100 %, unknown if currently breastfeeding. General appearance: alert, well-appearing, NAD Lungs: no respiratory distress Heart: regular rate  Abdomen: soft, non-tender; gravid  Pelvic: deferred Extremities: no significant LE edema Presentation: cephalic per RN check Fetal monitoring: 120s/mod/+a/-d Uterine  activity: every 2-5 minutes Dilation: 5 Effacement (%): 80, 90 Station: -3, -2 Exam by:: jolynn  Prenatal labs: ABO, Rh:  pending Antibody:  pending Rubella: Nonimmune (09/24 0000) RPR: Nonreactive (09/24 0000)  HBsAg: Negative (09/24 0000)  HIV: Non-reactive (09/24 0000)  GBS:   negative PCR Glucola: not done this pregnancy, no history  Genetic screening:  Not done   Prenatal Transfer Tool  Maternal Diabetes: No Genetic Screening: Normal Maternal Ultrasounds/Referrals: Normal per patient report, though report not available to review Fetal Ultrasounds or other Referrals:  None Maternal Substance Abuse:  No Significant Maternal Medications:  None Significant Maternal Lab Results: None  No results found for this or any previous visit (from the past 24 hour(s)).  Patient Active Problem List   Diagnosis Date Noted  . Indication for care in labor or delivery 06/04/2018  . Normal labor 06/01/2016    Assessment/Plan:  Erica Webb is a 28 y.o. G4P3003 at 7041w2d here for spontaneous onset of labor.   Labor: SOL.  -- pain control: epidural  Fetal Wellbeing: EFW 7lbs by Leopold's. Cephalic by sutures on RN check.  -- GBS (negative PCR) - no history of GBS in prior pregnancy -- continuous fetal monitoring - category I    Postpartum Planning -- breast/undecided -- RI/[not done]Tdap   Laurel S. Earlene PlaterWallace, DO OB/GYN Fellow

## 2018-06-04 NOTE — Anesthesia Procedure Notes (Signed)
Epidural Patient location during procedure: OB Start time: 06/04/2018 7:10 PM End time: 06/04/2018 7:21 PM  Staffing Anesthesiologist: Trevor Iha, MD Performed: anesthesiologist   Preanesthetic Checklist Completed: patient identified, site marked, surgical consent, pre-op evaluation, timeout performed, IV checked, risks and benefits discussed and monitors and equipment checked  Epidural Patient position: sitting Prep: site prepped and draped and DuraPrep Patient monitoring: continuous pulse ox and blood pressure Approach: midline Location: L3-L4 Injection technique: LOR air  Needle:  Needle type: Tuohy  Needle gauge: 17 G Needle length: 9 cm and 9 Needle insertion depth: 6 cm Catheter type: closed end flexible Catheter size: 19 Gauge Catheter at skin depth: 11 cm Test dose: negative  Assessment Events: blood not aspirated, injection not painful, no injection resistance, negative IV test and no paresthesia  Additional Notes Patient identified. Risks/Benefits/Options discussed with patient including but not limited to bleeding, infection, nerve damage, paralysis, failed block, incomplete pain control, headache, blood pressure changes, nausea, vomiting, reactions to medication both or allergic, itching and postpartum back pain. Confirmed with bedside nurse the patient's most recent platelet count. Confirmed with patient that they are not currently taking any anticoagulation, have any bleeding history or any family history of bleeding disorders. Patient expressed understanding and wished to proceed. All questions were answered. Sterile technique was used throughout the entire procedure. Please see nursing notes for vital signs. Test dose was given through epidural needle and negative prior to continuing to dose epidural or start infusion. Warning signs of high block given to the patient including shortness of breath, tingling/numbness in hands, complete motor block, or any  concerning symptoms with instructions to call for help. Patient was given instructions on fall risk and not to get out of bed. All questions and concerns addressed with instructions to call with any issues. 1 Attempt (S) . Patient tolerated procedure well.

## 2018-06-04 NOTE — Progress Notes (Signed)
Patient ID: Erica Webb, female   DOB: 1990/02/12, 28 y.o.   MRN: [id number] Progressing well Patient states she usually goes quickly  Vitals:   06/04/18 2001 06/04/18 2030 06/04/18 2038 06/04/18 2041  BP: 124/74 125/69 118/73 133/80  Pulse: 80 86 96 89  Resp:  18    Temp:      TempSrc:      SpO2: 99% 98%    Weight:      Height:       FHR reactive, Category I UCs every 3 minutes  Dilation: 6 Effacement (%): 80 Cervical Position: Middle Station: -2 Presentation: Vertex Exam by:: Neal Dy, RN  Anticipate SVD

## 2018-06-04 NOTE — MAU Note (Signed)
Was going to [facility], was d/c'd from their practice because of financial issues.  Has not been seen in a couple months.

## 2018-06-04 NOTE — Anesthesia Preprocedure Evaluation (Signed)
Anesthesia Evaluation  Patient identified by MRN, date of birth, ID band Patient awake    Reviewed: Allergy & Precautions, NPO status , Patient's Chart, lab work & pertinent test results  Airway Mallampati: II  TM Distance: >3 FB Neck ROM: Full    Dental no notable dental hx. (+) Teeth Intact   Pulmonary former smoker,    Pulmonary exam normal breath sounds clear to auscultation       Cardiovascular Exercise Tolerance: Good negative cardio ROS Normal cardiovascular exam Rhythm:Regular Rate:Normal     Neuro/Psych Anxiety    GI/Hepatic   Endo/Other  negative endocrine ROS  Renal/GU negative Renal ROS     Musculoskeletal   Abdominal   Peds  Hematology  (+) anemia , Hgb11.2 Plt150 T&S   Anesthesia Other Findings   Reproductive/Obstetrics (+) Pregnancy                             Anesthesia Physical Anesthesia Plan  ASA: II  Anesthesia Plan: Epidural   Post-op Pain Management:    Induction:   PONV Risk Score and Plan:   Airway Management Planned:   Additional Equipment:   Intra-op Plan:   Post-operative Plan:   Informed Consent: I have reviewed the patients History and Physical, chart, labs and discussed the procedure including the risks, benefits and alternatives for the proposed anesthesia with the patient or authorized representative who has indicated his/her understanding and acceptance.       Plan Discussed with:   Anesthesia Plan Comments: (40wk 2d G4P3 For LEA)        Anesthesia Quick Evaluation

## 2018-06-05 ENCOUNTER — Encounter (HOSPITAL_COMMUNITY): Payer: Self-pay

## 2018-06-05 LAB — RPR: RPR Ser Ql: NONREACTIVE

## 2018-06-05 LAB — ABO/RH: ABO/RH(D): A POS

## 2018-06-05 MED ORDER — ACETAMINOPHEN 325 MG PO TABS: 650.0000 mg | ORAL_TABLET | ORAL | Status: DC | PRN

## 2018-06-05 MED ORDER — TETANUS-DIPHTH-ACELL PERTUSSIS 5-2.5-18.5 LF-MCG/0.5 IM SUSP: 0.5000 mL | Freq: Once | INTRAMUSCULAR | Status: AC

## 2018-06-05 MED ORDER — WITCH HAZEL-GLYCERIN EX PADS: 1.0000 "application " | MEDICATED_PAD | CUTANEOUS | Status: DC | PRN

## 2018-06-05 MED ORDER — ONDANSETRON HCL 4 MG PO TABS: 4.0000 mg | ORAL_TABLET | ORAL | Status: DC | PRN

## 2018-06-05 MED ORDER — PRENATAL MULTIVITAMIN CH
1.0000 | ORAL_TABLET | Freq: Every day | ORAL | Status: DC
  Administered 2018-06-05 – 2018-06-06 (×2): 1 via ORAL
  Filled 2018-06-05 (×2): qty 1

## 2018-06-05 MED ORDER — COCONUT OIL OIL: 1.0000 "application " | TOPICAL_OIL | Status: DC | PRN

## 2018-06-05 MED ORDER — SENNOSIDES-DOCUSATE SODIUM 8.6-50 MG PO TABS
2.0000 | ORAL_TABLET | ORAL | Status: DC
  Administered 2018-06-05 (×2): 2 via ORAL
  Filled 2018-06-05 (×2): qty 2

## 2018-06-05 MED ORDER — BENZOCAINE-MENTHOL 20-0.5 % EX AERO: 1.0000 "application " | INHALATION_SPRAY | CUTANEOUS | Status: DC | PRN

## 2018-06-05 MED ORDER — ONDANSETRON HCL 4 MG/2ML IJ SOLN: 4.0000 mg | INTRAMUSCULAR | Status: DC | PRN

## 2018-06-05 MED ORDER — DIPHENHYDRAMINE HCL 25 MG PO CAPS: 25.0000 mg | ORAL_CAPSULE | Freq: Four times a day (QID) | ORAL | Status: DC | PRN

## 2018-06-05 MED ORDER — ZOLPIDEM TARTRATE 5 MG PO TABS: 5.0000 mg | ORAL_TABLET | Freq: Every evening | ORAL | Status: DC | PRN

## 2018-06-05 MED ORDER — SIMETHICONE 80 MG PO CHEW: 80.0000 mg | CHEWABLE_TABLET | ORAL | Status: DC | PRN

## 2018-06-05 MED ORDER — IBUPROFEN 600 MG PO TABS
600.0000 mg | ORAL_TABLET | Freq: Four times a day (QID) | ORAL | Status: DC
  Administered 2018-06-05 – 2018-06-06 (×7): 600 mg via ORAL
  Filled 2018-06-05 (×7): qty 1

## 2018-06-05 MED ORDER — DIBUCAINE (PERIANAL) 1 % EX OINT: 1.0000 "application " | TOPICAL_OINTMENT | CUTANEOUS | Status: DC | PRN

## 2018-06-05 NOTE — Progress Notes (Signed)
Post Partum Day 1 Subjective: no complaints, up ad lib, voiding and tolerating PO Sleepy Baby in Couplet Care due to transition issues, now improved  Objective: Blood pressure (!) 110/50, pulse 74, temperature 98.1 F (36.7 C), temperature source Oral, resp. rate 17, height 5\' 7"  (1.702 m), weight 80.3 kg, last menstrual period 08/26/2017, SpO2 98 %, unknown if currently breastfeeding.  Physical Exam:  General: cooperative, fatigued and no distress Lochia: appropriate Uterine Fundus: firm Incision: n/a DVT Evaluation: No evidence of DVT seen on physical exam.  Recent Labs    06/04/18 1837  HGB 11.2*  HCT 34.0*    Assessment/Plan: Plan for discharge tomorrow and Breastfeeding   LOS: 1 day   Wynelle Bourgeois 06/05/2018, 6:24 AM

## 2018-06-05 NOTE — Anesthesia Postprocedure Evaluation (Signed)
Anesthesia Post Note  Patient: Erica Webb  Procedure(s) Performed: AN AD HOC LABOR EPIDURAL     Patient location during evaluation: Mother Baby Anesthesia Type: Epidural Level of consciousness: awake, awake and alert and oriented Pain management: pain level controlled Vital Signs Assessment: post-procedure vital signs reviewed and stable Respiratory status: spontaneous breathing and respiratory function stable Cardiovascular status: blood pressure returned to baseline Postop Assessment: no headache, no backache, epidural receding, patient able to bend at knees, no apparent nausea or vomiting, able to ambulate and adequate PO intake Anesthetic complications: no    Last Vitals:  Vitals:   06/05/18 0130 06/05/18 0530  BP: 115/73 (!) 110/50  Pulse: 80 74  Resp: 18 17  Temp: 36.9 C 36.7 C  SpO2: 99% 98%    Last Pain:  Vitals:   06/05/18 0718  TempSrc:   PainSc: 0-No pain   Pain Goal:                   Cleda Clarks

## 2018-06-05 NOTE — Lactation Note (Signed)
This note was copied from a baby's chart. Lactation Consultation Note  Patient Name: Erica Webb Date: 06/05/2018 Reason for consult: Initial assessment;Term  Visited with P4 Mom at 59 hrs old.  Baby transitioned back from NICU and is doing well.  Mom resting in bed with baby swaddled.  Mom denies any difficulty with latching, and denies needing help.  Baby has had 4 feedings and 2 attempts.    Mom has history of breastfeeding her first 3 babies.  Milk supply was good for exclusive breastfeeding until they were 2 months old, when she "couldn't keep up".  Talked about adding pc pumping to support a full milk supply, but Mom declined.  Encouraged keeping baby STS as much as possible.  Goal of 8-12 feedings per 24 hrs.  Reviewed importance of deep latch to breast.  Mom informed of IP and OP Lactation support available to her.    Lactation brochure given to Mom.    Consult Status Consult Status: Follow-up Date: 06/06/18 Follow-up type: In-patient    [name] 06/05/2018, 3:31 PM

## 2018-06-05 NOTE — Progress Notes (Signed)
CLINICAL SOCIAL WORK MATERNAL/CHILD NOTE  Patient Details  Name: Erica Webb MRN: [id number] Date of Birth: 06/04/2018  Date:  06/05/2018  Clinical Social Worker Initiating Note:  Durward Fortes, LCSWA      Date/Time: Initiated:  06/05/18/1325             Child's Name:  Erica Webb    Biological Parents:  Mother, Father(Cacey Hodeges (MOB), and Chrisandra Carota (FOB))   Need for Interpreter:  None   Reason for Referral:  Late or No Prenatal Care (MOB with limited [facility] as insurance ran out and new OB refused to MOB with MOB being far in pregnancy.)   Address:  [address] [place] [address]    Phone number:  [phone] (home)     Additional phone number: none   Household Members/Support Persons (HM/SP):   Household Member/Support Person 2, Household Member/Support Person 1   HM/SP Name Relationship DOB or Age  HM/SP -1 Nivia Hodeges (MOB    HM/SP -2 Dietrich Pates (Sibling)     HM/SP -3   Chris Hodeges (FOB)    HM/SP -[place] (Sibling)     HM/SP -5   Dalaysia Harms (sibling)     HM/SP -6     HM/SP -7     HM/SP -8       Natural Supports (not living in the home): Extended Family   Professional Supports:None   Employment:Other (comment)(MOB laid off at this time. 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)   Homebound arranged:  none   Financial Resources:Medicaid   Other Resources: Physicist, medical , [place] Considerations Which May Impact Care: none reported  Strengths: Ability to meet basic needs , Compliance with medical plan , Home prepared for child , Pediatrician chosen, Psychotropic Medications   Psychotropic Medications:  Zoloft(100 mg daily)      Pediatrician:    [place]  Pediatrician List:   [facility]  [place]     Pediatrician Fax Number:    Risk Factors/Current Problems: Substance Use (MOB reported that she used [facility] in March 2020)   Cognitive State: Alert , Insightful    Mood/Affect: Bright , Calm , Comfortable , Interested    CSW Assessment:CSW consulted as MOB had limited [place]. CSW met with MOB at bedside to discuss this. CSW entered the room where CSW observed that MOB was lying in bed holding infant and FOB was on couch in sitting position. CSW congratulated MOB and FOB on the birth of infant. CSW was advised that FOB was about to leave and go home to care for other children at home.   CSW spoke with MOB alone once FOB left. CSW introduced role as well as informed MOB reason for the visit. MOB reported that she was getting her [facility] when she was granted Medicaid. MOB expressed that at that time [facility] refused to see MOB as they no do not take Medicaid. MOB reported that she tried to get care established with [facility] however they refused to take MOB as she was to far along in pregnancy. MOB expressed that at that time MOB didn't pursue any further care as MOB was turned down in other situations. CSW advised MOB of the hospital drug screen policy. MOB agreeable and reported  that she did use THC in March 2020 as she really nauseous dan unable to eat. MOB reported that using THC allowed her to eat.   SCW was advised that MOB has three older sons. Hayden (5), Liam (4) and [employer] (2). 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 MOB reported that she was isolating herself and felt really depressed. MOB reported that she always slept. This lasted a year and a half per MOB's report. CSW spoke with MOB about how she coped during this time. MOB expressed that she didn't. CSW offered MOB resources for PPD if it occurs after this and MOB very receptive to information given.    CSW was advised that  supports for MOB include her spouse and grandma. MOB expressed that she has a history of anxiety/depressin and is currently taking 100 mg Zoloft daily. MOB expressed that this is very helpful for her at this time. MOB denies SI and HI as well as reports that she is not involved in DV relationship. MOB informed CSW that infant will sleep in basinet once home. MOB denies any further needs at this time. CSW will continue to monitor UDS as well as CDS for infant.    CSW Plan/Description: No Further Intervention Required/No Barriers to Discharge, Sudden Infant Death Syndrome (SIDS) Education, [place], Other Patient/Family Education, CSW Will Continue to Monitor Umbilical Cord Tissue Drug Screen Results and Make Report if Mardene Sayer, [place] 06/05/2018, 2:10 PM

## 2018-06-06 MED ORDER — IBUPROFEN 600 MG PO TABS: 600.0000 mg | ORAL_TABLET | Freq: Four times a day (QID) | ORAL | 0 refills | Status: AC | PRN

## 2018-06-06 NOTE — Discharge Instructions (Signed)
Vaginal Delivery, Care After °Refer to this sheet in the next few weeks. These instructions provide you with information about caring for yourself after vaginal delivery. Your health care provider may also give you more specific instructions. Your treatment has been planned according to current medical practices, but problems sometimes occur. Call your health care provider if you have any problems or questions. °What can I expect after the procedure? °After vaginal delivery, it is common to have: °· Some bleeding from your vagina. °· Soreness in your abdomen, your vagina, and the area of skin between your vaginal opening and your anus (perineum). °· Pelvic cramps. °· Fatigue. °Follow these instructions at home: °Medicines °· Take over-the-counter and prescription medicines only as told by your health care provider. °· If you were prescribed an antibiotic medicine, take it as told by your health care provider. Do not stop taking the antibiotic until it is finished. °Driving ° °· Do not drive or operate heavy machinery while taking prescription pain medicine. °· Do not drive for 24 hours if you received a sedative. °Lifestyle °· Do not drink alcohol. This is especially important if you are breastfeeding or taking medicine to relieve pain. °· Do not use tobacco products, including cigarettes, chewing tobacco, or e-cigarettes. If you need help quitting, ask your health care provider. °Eating and drinking °· Drink at least 8 eight-ounce glasses of water every day unless you are told not to by your health care provider. If you choose to breastfeed your baby, you may need to drink more water than this. °· Eat high-fiber foods every day. These foods may help prevent or relieve constipation. High-fiber foods include: °? Whole grain cereals and breads. °? Brown rice. °? Beans. °? Fresh fruits and vegetables. °Activity °· Return to your normal activities as told by your health care provider. Ask your health care provider what  activities are safe for you. °· Rest as much as possible. Try to rest or take a nap when your baby is sleeping. °· Do not lift anything that is heavier than your baby or 10 lb (4.5 kg) until your health care provider says that it is safe. °· Talk with your health care provider about when you can engage in sexual activity. This may depend on your: °? Risk of infection. °? Rate of healing. °? Comfort and desire to engage in sexual activity. °Vaginal Care °· If you have an episiotomy or a vaginal tear, check the area every day for signs of infection. Check for: °? More redness, swelling, or pain. °? More fluid or blood. °? Warmth. °? Pus or a bad smell. °· Do not use tampons or douches until your health care provider says this is safe. °· Watch for any blood clots that may pass from your vagina. These may look like clumps of dark red, brown, or black discharge. °General instructions °· Keep your perineum clean and dry as told by your health care provider. °· Wear loose, comfortable clothing. °· Wipe from front to back when you use the toilet. °· Ask your health care provider if you can shower or take a bath. If you had an episiotomy or a perineal tear during labor and delivery, your health care provider may tell you not to take baths for a certain length of time. °· Wear a bra that supports your breasts and fits you well. °· If possible, have someone help you with household activities and help care for your baby for at least a few days after you   leave the hospital. °· Keep all follow-up visits for you and your baby as told by your health care provider. This is important. °Contact a health care provider if: °· You have: °? Vaginal discharge that has a bad smell. °? Difficulty urinating. °? Pain when urinating. °? A sudden increase or decrease in the frequency of your bowel movements. °? More redness, swelling, or pain around your episiotomy or vaginal tear. °? More fluid or blood coming from your episiotomy or vaginal  tear. °? Pus or a bad smell coming from your episiotomy or vaginal tear. °? A fever. °? A rash. °? Little or no interest in activities you used to enjoy. °? Questions about caring for yourself or your baby. °· Your episiotomy or vaginal tear feels warm to the touch. °· Your episiotomy or vaginal tear is separating or does not appear to be healing. °· Your breasts are painful, hard, or turn red. °· You feel unusually sad or worried. °· You feel nauseous or you vomit. °· You pass large blood clots from your vagina. If you pass a blood clot from your vagina, save it to show to your health care provider. Do not flush blood clots down the toilet without having your health care provider look at them. °· You urinate more than usual. °· You are dizzy or light-headed. °· You have not breastfed at all and you have not had a menstrual period for 12 weeks after delivery. °· You have stopped breastfeeding and you have not had a menstrual period for 12 weeks after you stopped breastfeeding. °Get help right away if: °· You have: °? Pain that does not go away or does not get better with medicine. °? Chest pain. °? Difficulty breathing. °? Blurred vision or spots in your vision. °? Thoughts about hurting yourself or your baby. °· You develop pain in your abdomen or in one of your legs. °· You develop a severe headache. °· You faint. °· You bleed from your vagina so much that you fill two sanitary pads in one hour. °This information is not intended to replace advice given to you by your health care provider. Make sure you discuss any questions you have with your health care provider. °Document Released: 01/21/2000 Document Revised: 07/07/2015 Document Reviewed: 02/07/2015 °Elsevier Interactive Patient Education © 2019 [employer] ° °

## 2018-06-13 DIAGNOSIS — F330 Major depressive disorder, recurrent, mild: Secondary | ICD-10-CM | POA: Diagnosis not present

## 2018-06-13 DIAGNOSIS — F900 Attention-deficit hyperactivity disorder, predominantly inattentive type: Secondary | ICD-10-CM | POA: Diagnosis not present

## 2018-06-13 DIAGNOSIS — F411 Generalized anxiety disorder: Secondary | ICD-10-CM | POA: Diagnosis not present

## 2018-06-13 DIAGNOSIS — G4700 Insomnia, unspecified: Secondary | ICD-10-CM | POA: Diagnosis not present

## 2018-06-14 DIAGNOSIS — M25511 Pain in right shoulder: Secondary | ICD-10-CM | POA: Diagnosis not present

## 2018-06-17 ENCOUNTER — Telehealth: Payer: Self-pay | Admitting: Obstetrics & Gynecology

## 2018-06-17 NOTE — Telephone Encounter (Signed)
Called the patient to inform of the upcoming appointment. She stated she would like the mirena for her birth control method. Changed the appointment to a face to face visit.

## 2018-07-02 ENCOUNTER — Encounter: Payer: Self-pay | Admitting: Student

## 2018-07-02 ENCOUNTER — Ambulatory Visit (INDEPENDENT_AMBULATORY_CARE_PROVIDER_SITE_OTHER): Payer: BC Managed Care – PPO | Admitting: Student

## 2018-07-02 ENCOUNTER — Other Ambulatory Visit: Payer: Self-pay

## 2018-07-02 VITALS — BP 109/66 | HR 88 | Temp 98.5°F | Ht 67.0 in | Wt 158.1 lb

## 2018-07-02 DIAGNOSIS — Z30430 Encounter for insertion of intrauterine contraceptive device: Secondary | ICD-10-CM

## 2018-07-02 DIAGNOSIS — Z0000 Encounter for general adult medical examination without abnormal findings: Secondary | ICD-10-CM

## 2018-07-02 DIAGNOSIS — Z1389 Encounter for screening for other disorder: Secondary | ICD-10-CM | POA: Diagnosis not present

## 2018-07-02 LAB — POCT PREGNANCY, URINE: Preg Test, Ur: NEGATIVE

## 2018-07-02 MED ORDER — LEVONORGESTREL 20 MCG/24HR IU IUD
INTRAUTERINE_SYSTEM | Freq: Once | INTRAUTERINE | Status: AC
  Administered 2018-07-02: 17:00:00 via INTRAUTERINE

## 2018-07-02 MED ORDER — IBUPROFEN 200 MG PO TABS
600.0000 mg | ORAL_TABLET | Freq: Once | ORAL | Status: AC
  Administered 2018-07-02: 600 mg via ORAL

## 2018-07-02 NOTE — Progress Notes (Signed)
Subjective:     Erica Webb is a 28 y.o. female who presents for a postpartum visit. She is 4 weeks postpartum following a spontaneous vaginal delivery. I have fully reviewed the prenatal and intrapartum course. The delivery was at 40/2 gestational weeks. Outcome: spontaneous vaginal delivery. Anesthesia: epidural. Postpartum course has been uneventful. Baby's course has been uneventful. Baby is feeding by both breast and bottle - [employer] Good Start. Bleeding no bleeding. Bowel function is normal. Bladder function is normal. Patient is not sexually active. Contraception method is none. Postpartum depression screening: negative.  The following portions of the patient's history were reviewed and updated as appropriate: allergies, current medications, past family history, past medical history, past social history, past surgical history and problem list.  Review of Systems Pertinent items are noted in HPI.   Objective:    BP 109/66   Pulse 88   Temp 98.5 F (36.9 C)   Ht 5\' 7"  (1.702 m)   Wt 158 lb 1.6 oz (71.7 kg)   LMP 08/26/2017   Breastfeeding Yes Comment: Both  BMI 24.76 kg/m   General:  alert, cooperative and no distress   Breasts:  inspection negative, no nipple discharge or bleeding, no masses or nodularity palpable  Lungs: clear to auscultation bilaterally  Heart:  regular rate and rhythm, S1, S2 normal, no murmur, click, rub or gallop  Abdomen: soft, non-tender; bowel sounds normal; no masses,  no organomegaly   Vulva:  normal  Vagina: normal vagina  Cervix:  normal  Corpus: not examined  Adnexa:  not evaluated  Rectal Exam: Not performed.        Assessment:    Healthy  postpartum exam. Pap smear done at today's visit.   Plan:    1. Contraception: IUD . See insertion note below.  2. Pap collected today.  3. Follow up in: 4 weeks or as needed.    IUD placed by Erica Webb   GYNECOLOGY CLINIC PROCEDURE NOTE  Erica Webb is a 28 y.o. [phone] here for  Mirena IUD insertion. No GYN concerns.  Last pap smear was unknown; collected today.   IUD Insertion Procedure Note Patient identified, informed consent performed, consent signed.   Discussed risks of irregular bleeding, cramping, infection, malpositioning or misplacement of the IUD outside the uterus which may require further procedure such as laparoscopy. Time out was performed.  Urine pregnancy test negative.  Speculum placed in the vagina.  Cervix visualized.  Cleaned with Betadine x 2.  Grasped anteriorly with a single tooth tenaculum.  Uterus sounded to 7 cm.  Mirena IUD placed per manufacturer's recommendations.  Strings trimmed to 3 cm. Tenaculum was removed, good hemostasis noted.  Patient tolerated procedure well.   Patient was given post-procedure instructions.  She was advised to have backup contraception for one week.  Patient was also asked to check IUD strings periodically and follow up in 4 weeks for IUD check.

## 2018-07-02 NOTE — Patient Instructions (Signed)
Intrauterine Device Insertion, Care After    This sheet gives you information about how to care for yourself after your procedure. Your health care provider may also give you more specific instructions. If you have problems or questions, contact your health care provider.  What can I expect after the procedure?  After the procedure, it is common to have:  · Cramps and pain in the abdomen.  · Light bleeding (spotting) or heavier bleeding that is like your menstrual period. This may last for up to a few days.  · Lower back pain.  · Dizziness.  · Headaches.  · Nausea.  Follow these instructions at home:  · Before resuming sexual activity, check to make sure that you can feel the IUD string(s). You should be able to feel the end of the string(s) below the opening of your cervix. If your IUD string is in place, you may resume sexual activity.  ? If you had a hormonal IUD inserted more than 7 days after your most recent period started, you will need to use a backup method of birth control for 7 days after IUD insertion. Ask your health care provider whether this applies to you.  · Continue to check that the IUD is still in place by feeling for the string(s) after every menstrual period, or once a month.  · Take over-the-counter and prescription medicines only as told by your health care provider.  · Do not drive or use heavy machinery while taking prescription pain medicine.  · Keep all follow-up visits as told by your health care provider. This is important.  Contact a health care provider if:  · You have bleeding that is heavier or lasts longer than a normal menstrual cycle.  · You have a fever.  · You have cramps or abdominal pain that get worse or do not get better with medicine.  · You develop abdominal pain that is new or is not in the same area of earlier cramping and pain.  · You feel lightheaded or weak.  · You have abnormal or bad-smelling discharge from your vagina.  · You have pain during sexual  activity.  · You have any of the following problems with your IUD string(s):  ? The string bothers or hurts you or your sexual partner.  ? You cannot feel the string.  ? The string has gotten longer.  · You can feel the IUD in your vagina.  · You think you may be pregnant, or you miss your menstrual period.  · You think you may have an STI (sexually transmitted infection).  Get help right away if:  · You have flu-like symptoms.  · You have a fever and chills.  · You can feel that your IUD has slipped out of place.  Summary  · After the procedure, it is common to have cramps and pain in the abdomen. It is also common to have light bleeding (spotting) or heavier bleeding that is like your menstrual period.  · Continue to check that the IUD is still in place by feeling for the string(s) after every menstrual period, or once a month.  · Keep all follow-up visits as told by your health care provider. This is important.  · Contact your health care provider if you have problems with your IUD string(s), such as the string getting longer or bothering you or your sexual partner.  This information is not intended to replace advice given to you by your health care provider. Make   sure you discuss any questions you have with your health care provider.  Document Released: 09/21/2010 Document Revised: 12/15/2015 Document Reviewed: 12/15/2015  Elsevier Interactive Patient Education © 2019 [employer] Inc.

## 2018-07-03 DIAGNOSIS — M25511 Pain in right shoulder: Secondary | ICD-10-CM | POA: Diagnosis not present

## 2018-07-04 LAB — CYTOLOGY - PAP: Diagnosis: NEGATIVE

## 2018-07-30 ENCOUNTER — Ambulatory Visit (INDEPENDENT_AMBULATORY_CARE_PROVIDER_SITE_OTHER): Payer: BC Managed Care – PPO | Admitting: Advanced Practice Midwife

## 2018-07-30 ENCOUNTER — Encounter: Payer: Self-pay | Admitting: Advanced Practice Midwife

## 2018-07-30 ENCOUNTER — Other Ambulatory Visit: Payer: Self-pay

## 2018-07-30 DIAGNOSIS — Z30431 Encounter for routine checking of intrauterine contraceptive device: Secondary | ICD-10-CM | POA: Insufficient documentation

## 2018-07-30 NOTE — Progress Notes (Signed)
    GYNECOLOGY OFFICE ENCOUNTER NOTE  History:  Erica Webb is a 28 y.o. year old female here today for today for IUD string check; Mirena IUD was placed 07/02/18 at her PP visit.   Reports the following since IUD insertion: Bleeding Spotting Abd pain: denies Fever: denies Dyspareunia: denies  The following portions of the patient's history were reviewed and updated as appropriate: allergies, current medications, past family history, past medical history, past social history, past surgical history and problem list. Last pap smear on 07/02/18 was normal.  Review of Systems:  Pertinent items are noted in HPI.   Objective:  Physical Exam Blood pressure 101/70, pulse 75, height 5\' 4"  (1.626 m), weight 160 lb (72.6 kg), last menstrual period 03/30/2018, currently breastfeeding. CONSTITUTIONAL: Well-developed, well-nourished female in no acute distress.  SKIN: No pallor HENT:  Normocephalic, atraumatic. External right and left ear normal. Oropharynx is clear and moist EYES: Conjunctivae. No scleral icterus.  CARDIOVASCULAR: Normal heart rate noted RESPIRATORY: Effort normal, no problems with respiration noted ABDOMEN: Soft, no distention noted.   PELVIC: Normal appearing external genitalia; normal appearing vaginal mucosa and cervix, scant bleeding Assessment & Plan:  Patient to keep IUD in place for up to 5 years; can come in for removal if she desires pregnancy earlier or for any concerning side effects.  Tamala Julian, [place], [place] 07/30/2018 11:20 AM

## 2018-07-30 NOTE — Patient Instructions (Signed)
Preventive Care 18-39 Years, Female Preventive care refers to lifestyle choices and visits with your health care provider that can promote health and wellness. What does preventive care include?   A yearly physical exam. This is also called an annual well check.  Dental exams once or twice a year.  Routine eye exams. Ask your health care provider how often you should have your eyes checked.  Personal lifestyle choices, including: ? Daily care of your teeth and gums. ? Regular physical activity. ? Eating a healthy diet. ? Avoiding tobacco and drug use. ? Limiting alcohol use. ? Practicing safe sex. ? Taking vitamin and mineral supplements as recommended by your health care provider. What happens during an annual well check? The services and screenings done by your health care provider during your annual well check will depend on your age, overall health, lifestyle risk factors, and family history of disease. Counseling Your health care provider may ask you questions about your:  Alcohol use.  Tobacco use.  Drug use.  Emotional well-being.  Home and relationship well-being.  Sexual activity.  Eating habits.  Work and work environment.  Method of birth control.  Menstrual cycle.  Pregnancy history. Screening You may have the following tests or measurements:  Height, weight, and BMI.  Diabetes screening. This is done by checking your blood sugar (glucose) after you have not eaten for a while (fasting).  Blood pressure.  Lipid and cholesterol levels. These may be checked every 5 years starting at age 20.  Skin check.  Hepatitis C blood test.  Hepatitis B blood test.  Sexually transmitted disease (STD) testing.  BRCA-related cancer screening. This may be done if you have a family history of breast, ovarian, tubal, or peritoneal cancers.  Pelvic exam and Pap test. This may be done every 3 years starting at age 21. Starting at age 30, this may be done every 5  years if you have a Pap test in combination with an HPV test. Discuss your test results, treatment options, and if necessary, the need for more tests with your health care provider. Vaccines Your health care provider may recommend certain vaccines, such as:  Influenza vaccine. This is recommended every year.  Tetanus, diphtheria, and acellular pertussis (Tdap, Td) vaccine. You may need a Td booster every 10 years.  Varicella vaccine. You may need this if you have not been vaccinated.  HPV vaccine. If you are 26 or younger, you may need three doses over 6 months.  Measles, mumps, and rubella (MMR) vaccine. You may need at least one dose of MMR. You may also need a second dose.  Pneumococcal 13-valent conjugate (PCV13) vaccine. You may need this if you have certain conditions and were not previously vaccinated.  Pneumococcal polysaccharide (PPSV23) vaccine. You may need one or two doses if you smoke cigarettes or if you have certain conditions.  Meningococcal vaccine. One dose is recommended if you are age 19-21 years and a first-year college student living in a residence hall, or if you have one of several medical conditions. You may also need additional booster doses.  Hepatitis A vaccine. You may need this if you have certain conditions or if you travel or work in places where you may be exposed to hepatitis A.  Hepatitis B vaccine. You may need this if you have certain conditions or if you travel or work in places where you may be exposed to hepatitis B.  Haemophilus influenzae type b (Hib) vaccine. You may need this if you   have certain risk factors. Talk to your health care provider about which screenings and vaccines you need and how often you need them. This information is not intended to replace advice given to you by your health care provider. Make sure you discuss any questions you have with your health care provider. Document Released: 03/21/2001 Document Revised: 09/05/2016  Document Reviewed: 11/24/2014 Elsevier Interactive Patient Education  2019 [employer].

## 2018-09-11 DIAGNOSIS — J309 Allergic rhinitis, unspecified: Secondary | ICD-10-CM | POA: Diagnosis not present

## 2018-09-11 DIAGNOSIS — J45909 Unspecified asthma, uncomplicated: Secondary | ICD-10-CM | POA: Diagnosis not present

## 2018-09-11 DIAGNOSIS — F900 Attention-deficit hyperactivity disorder, predominantly inattentive type: Secondary | ICD-10-CM | POA: Diagnosis not present

## 2018-09-16 DIAGNOSIS — G545 Neuralgic amyotrophy: Secondary | ICD-10-CM | POA: Diagnosis not present

## 2018-09-24 DIAGNOSIS — N760 Acute vaginitis: Secondary | ICD-10-CM | POA: Diagnosis not present

## 2018-10-02 DIAGNOSIS — M2180 Other specified acquired deformities of unspecified limb: Secondary | ICD-10-CM | POA: Diagnosis not present

## 2018-10-02 DIAGNOSIS — M25511 Pain in right shoulder: Secondary | ICD-10-CM | POA: Diagnosis not present

## 2018-10-02 DIAGNOSIS — G545 Neuralgic amyotrophy: Secondary | ICD-10-CM | POA: Diagnosis not present

## 2018-10-16 DIAGNOSIS — M25511 Pain in right shoulder: Secondary | ICD-10-CM | POA: Diagnosis not present

## 2018-11-01 DIAGNOSIS — F900 Attention-deficit hyperactivity disorder, predominantly inattentive type: Secondary | ICD-10-CM | POA: Diagnosis not present

## 2018-11-01 DIAGNOSIS — F411 Generalized anxiety disorder: Secondary | ICD-10-CM | POA: Diagnosis not present

## 2018-11-01 DIAGNOSIS — F330 Major depressive disorder, recurrent, mild: Secondary | ICD-10-CM | POA: Diagnosis not present

## 2018-11-01 DIAGNOSIS — G4700 Insomnia, unspecified: Secondary | ICD-10-CM | POA: Diagnosis not present

## 2018-11-28 DIAGNOSIS — G4700 Insomnia, unspecified: Secondary | ICD-10-CM | POA: Diagnosis not present

## 2018-11-28 DIAGNOSIS — F411 Generalized anxiety disorder: Secondary | ICD-10-CM | POA: Diagnosis not present

## 2018-11-28 DIAGNOSIS — F900 Attention-deficit hyperactivity disorder, predominantly inattentive type: Secondary | ICD-10-CM | POA: Diagnosis not present

## 2019-01-09 DIAGNOSIS — F330 Major depressive disorder, recurrent, mild: Secondary | ICD-10-CM | POA: Diagnosis not present

## 2019-01-09 DIAGNOSIS — G4700 Insomnia, unspecified: Secondary | ICD-10-CM | POA: Diagnosis not present

## 2019-01-09 DIAGNOSIS — F411 Generalized anxiety disorder: Secondary | ICD-10-CM | POA: Diagnosis not present

## 2019-01-09 DIAGNOSIS — F900 Attention-deficit hyperactivity disorder, predominantly inattentive type: Secondary | ICD-10-CM | POA: Diagnosis not present

## 2019-04-10 DIAGNOSIS — F900 Attention-deficit hyperactivity disorder, predominantly inattentive type: Secondary | ICD-10-CM | POA: Diagnosis not present

## 2019-04-10 DIAGNOSIS — F411 Generalized anxiety disorder: Secondary | ICD-10-CM | POA: Diagnosis not present

## 2019-04-10 DIAGNOSIS — G4700 Insomnia, unspecified: Secondary | ICD-10-CM | POA: Diagnosis not present
